# Patient Record
Sex: Female | Born: 1980
Health system: Southern US, Community
[De-identification: ages and names within clinical notes are randomized; demographics above are authoritative.]

## PROBLEM LIST (undated history)

## (undated) ENCOUNTER — Inpatient Hospital Stay (HOSPITAL_COMMUNITY): Payer: Self-pay

## (undated) DIAGNOSIS — M549 Dorsalgia, unspecified: Secondary | ICD-10-CM

## (undated) DIAGNOSIS — O24419 Gestational diabetes mellitus in pregnancy, unspecified control: Secondary | ICD-10-CM

## (undated) DIAGNOSIS — N200 Calculus of kidney: Secondary | ICD-10-CM

## (undated) DIAGNOSIS — R51 Headache: Secondary | ICD-10-CM

## (undated) DIAGNOSIS — N390 Urinary tract infection, site not specified: Secondary | ICD-10-CM

## (undated) DIAGNOSIS — E119 Type 2 diabetes mellitus without complications: Secondary | ICD-10-CM

## (undated) DIAGNOSIS — G43909 Migraine, unspecified, not intractable, without status migrainosus: Secondary | ICD-10-CM

## (undated) DIAGNOSIS — F419 Anxiety disorder, unspecified: Secondary | ICD-10-CM

## (undated) HISTORY — PX: CHOLECYSTECTOMY: SHX55

## (undated) HISTORY — PX: DILATION AND CURETTAGE OF UTERUS: SHX78

## (undated) HISTORY — PX: WISDOM TOOTH EXTRACTION: SHX21

## (undated) HISTORY — PX: TONSILLECTOMY: SUR1361

---

## 2013-05-14 ENCOUNTER — Encounter (HOSPITAL_COMMUNITY): Payer: Self-pay | Admitting: *Deleted

## 2013-05-14 ENCOUNTER — Inpatient Hospital Stay (HOSPITAL_COMMUNITY)
Admission: AD | Admit: 2013-05-14 | Discharge: 2013-05-14 | Disposition: A | Discharge status: Home / Self Care | Payer: Medicaid Other | Source: Ambulatory Visit | Attending: Obstetrics & Gynecology | Admitting: Obstetrics & Gynecology

## 2013-05-14 ENCOUNTER — Inpatient Hospital Stay (HOSPITAL_COMMUNITY): Payer: Medicaid Other

## 2013-05-14 DIAGNOSIS — R109 Unspecified abdominal pain: Secondary | ICD-10-CM | POA: Insufficient documentation

## 2013-05-14 DIAGNOSIS — N390 Urinary tract infection, site not specified: Secondary | ICD-10-CM | POA: Insufficient documentation

## 2013-05-14 DIAGNOSIS — O26899 Other specified pregnancy related conditions, unspecified trimester: Secondary | ICD-10-CM

## 2013-05-14 DIAGNOSIS — A5901 Trichomonal vulvovaginitis: Secondary | ICD-10-CM | POA: Insufficient documentation

## 2013-05-14 DIAGNOSIS — O2390 Unspecified genitourinary tract infection in pregnancy, unspecified trimester: Secondary | ICD-10-CM | POA: Insufficient documentation

## 2013-05-14 DIAGNOSIS — A599 Trichomoniasis, unspecified: Secondary | ICD-10-CM

## 2013-05-14 DIAGNOSIS — O209 Hemorrhage in early pregnancy, unspecified: Secondary | ICD-10-CM

## 2013-05-14 DIAGNOSIS — O98819 Other maternal infectious and parasitic diseases complicating pregnancy, unspecified trimester: Secondary | ICD-10-CM | POA: Insufficient documentation

## 2013-05-14 LAB — URINALYSIS, ROUTINE W REFLEX MICROSCOPIC
Bilirubin Urine: NEGATIVE
Ketones, ur: NEGATIVE mg/dL
Nitrite: NEGATIVE
Protein, ur: NEGATIVE mg/dL
Urobilinogen, UA: 0.2 mg/dL (ref 0.0–1.0)
pH: 6.5 (ref 5.0–8.0)

## 2013-05-14 LAB — WET PREP, GENITAL
Clue Cells Wet Prep HPF POC: NONE SEEN
Yeast Wet Prep HPF POC: NONE SEEN

## 2013-05-14 LAB — URINE MICROSCOPIC-ADD ON

## 2013-05-14 LAB — ABO/RH: ABO/RH(D): A POS

## 2013-05-14 LAB — CBC
HCT: 40.9 % (ref 36.0–46.0)
Hemoglobin: 14 g/dL (ref 12.0–15.0)
MCH: 28.5 pg (ref 26.0–34.0)
MCHC: 34.2 g/dL (ref 30.0–36.0)
MCV: 83.1 fL (ref 78.0–100.0)
Platelets: 301 10*3/uL (ref 150–400)
RBC: 4.92 MIL/uL (ref 3.87–5.11)
RDW: 12.3 % (ref 11.5–15.5)
WBC: 9.7 10*3/uL (ref 4.0–10.5)

## 2013-05-14 LAB — HCG, QUANTITATIVE, PREGNANCY: hCG, Beta Chain, Quant, S: 26562 m[IU]/mL — ABNORMAL HIGH (ref <5)

## 2013-05-14 MED ORDER — NITROFURANTOIN MONOHYD MACRO 100 MG PO CAPS: 100.0000 mg | ORAL_CAPSULE | Freq: Two times a day (BID) | ORAL | Status: DC

## 2013-05-14 MED ORDER — METRONIDAZOLE 500 MG PO TABS: 500.0000 mg | ORAL_TABLET | Freq: Once | ORAL | Status: DC

## 2013-05-14 NOTE — MAU Note (Signed)
32 yo, G2P0 at [redacted]w[redacted]d, presents to MAU with c/o vaginal bleeding noted after wiping last night. Reports lower abdominal cramping noted this morning. Last intercourse 5 days ago.

## 2013-05-14 NOTE — Discharge Instructions (Signed)
Abdominal Pain During Pregnancy  Abdominal discomfort is common in pregnancy. Most of the time, it does not cause harm. There are many causes of abdominal pain. Some causes are more serious than others. Some of the causes of abdominal pain in pregnancy are easily diagnosed. Occasionally, the diagnosis takes time to understand. Other times, the cause is not determined. Abdominal pain can be a sign that something is very wrong with the pregnancy, or the pain may have nothing to do with the pregnancy at all. For this reason, always tell your caregiver if you have any abdominal discomfort.  CAUSES  Common and harmless causes of abdominal pain include:   Constipation.   Excess gas and bloating.   Round ligament pain. This is pain that is felt in the folds of the groin.   The position the baby or placenta is in.   Baby kicks.   Braxton-Hicks contractions. These are mild contractions that do not cause cervical dilation.  Serious causes of abdominal pain include:   Ectopic pregnancy. This happens when a fertilized egg implants outside of the uterus.   Miscarriage.   Preterm labor. This is when labor starts at less than 37 weeks of pregnancy.   Placental abruption. This is when the placenta partially or completely separates from the uterus.   Preeclampsia. This is often associated with high blood pressure and has been referred to as "toxemia in pregnancy."   Uterine or amniotic fluid infections.  Causes unrelated to pregnancy include:   Urinary tract infection.   Gallbladder stones or inflammation.   Hepatitis or other liver illness.   Intestinal problems, stomach flu, food poisoning, or ulcer.   Appendicitis.   Kidney (renal) stones.   Kidney infection (pylonephritis).  HOME CARE INSTRUCTIONS   For mild pain:   Do not have sexual intercourse or put anything in your vagina until your symptoms go away completely.   Get plenty of rest until your pain improves. If your pain does not improve in 1 hour, call  your caregiver.   Drink clear fluids if you feel nauseous. Avoid solid food as long as you are uncomfortable or nauseous.   Only take medicine as directed by your caregiver.   Keep all follow-up appointments with your caregiver.  SEEK IMMEDIATE MEDICAL CARE IF:   You are bleeding, leaking fluid, or passing tissue from the vagina.   You have increasing pain or cramping.   You have persistent vomiting.   You have painful or bloody urination.   You have a fever.   You notice a decrease in your baby's movements.   You have extreme weakness or feel faint.   You have shortness of breath, with or without abdominal pain.   You develop a severe headache with abdominal pain.   You have abnormal vaginal discharge with abdominal pain.   You have persistent diarrhea.   You have abdominal pain that continues even after rest, or gets worse.  MAKE SURE YOU:    Understand these instructions.   Will watch your condition.   Will get help right away if you are not doing well or get worse.  Document Released: 08/04/2005 Document Revised: 10/27/2011 Document Reviewed: 02/28/2011  Exit[employer] Patient Information 2014 [employer].

## 2013-05-14 NOTE — MAU Note (Signed)
Pt presents with complaints of abdominal pain and a small amount of vaginal bleeding since last night.

## 2013-05-14 NOTE — MAU Provider Note (Signed)
CSN: [id number]     Arrival date & time 05/14/13  1514 History   None    Chief Complaint  Patient presents with  . Abdominal Pain  . Vaginal Bleeding   (Consider location/radiation/quality/duration/timing/severity/associated sxs/prior Treatment) HPI Dawn Logan is a 32 y.o. G2P0010 at [redacted]w[redacted]d. LMP 8/15, nl. Pt started having low abd pressure off/onsince last night. She has had pink spotting with wiping since last evening also. No change in discharge,odor or itching. She has frequency, no urgency or burning. She feels nauseated, no vomiting, constipation or diarrhea. She has applied for Medicaid   History reviewed. No pertinent past medical history. Past Surgical History  Procedure Laterality Date  . Cholecystectomy    . Dilation and curettage of uterus     History reviewed. No pertinent family history. History  Substance Use Topics  . Smoking status: Former Smoker    Types: Cigarettes  . Smokeless tobacco: Never Used  . Alcohol Use: No   OB History   Grav Para Term Preterm Abortions TAB SAB Ect Mult Living   2 0 0 0 1 0 1 0 0 0      Review of Systems  Constitutional: Negative for fever and chills.  Gastrointestinal: Positive for nausea. Negative for vomiting, diarrhea and constipation.  Genitourinary: Positive for frequency, vaginal bleeding and pelvic pain. Negative for dysuria, urgency and vaginal discharge.    Allergies  Review of patient's allergies indicates no known allergies.  Home Medications  No current outpatient prescriptions on file. BP 119/65  Pulse 85  Temp(Src) 97.8 F (36.6 C)  Resp 16  Ht 5\' 4"  (1.626 m)  Wt 148 lb (67.132 kg)  BMI 25.39 kg/m2  LMP 04/01/2013 Physical Exam  Constitutional: She is oriented to person, place, and time. She appears well-developed and well-nourished.  Abdominal: Soft. There is no tenderness.  Genitourinary:  Pelvic exam: Ext gen- nl anatomy, skin intact Vagina- rose colored thick discharge Cx- closed Uterus-  enlarged 6-8 wk size, non tender Adn- non-tender, no masses palp  Musculoskeletal: Normal range of motion.  Neurological: She is alert and oriented to person, place, and time.  Skin: Skin is warm and dry.  Psychiatric: She has a normal mood and affect. Her behavior is normal.    ED Course  Procedures (including critical care time) Labs Review Labs Reviewed  URINALYSIS, ROUTINE W REFLEX MICROSCOPIC - Abnormal; Notable for the following:    Hgb urine dipstick TRACE (*)    Leukocytes, UA TRACE (*)    All other components within normal limits  URINE MICROSCOPIC-ADD ON - Abnormal; Notable for the following:    Squamous Epithelial / LPF FEW (*)    Bacteria, UA FEW (*)    All other components within normal limits  POCT PREGNANCY, URINE - Abnormal; Notable for the following:    Preg Test, Ur POSITIVE (*)    All other components within normal limits  URINE CULTURE  WET PREP, GENITAL  GC/CHLAMYDIA PROBE AMP  CBC  HCG, QUANTITATIVE, PREGNANCY  ABO/RH   Imaging Review No results found. Results for orders placed during the hospital encounter of 05/14/13 (from the past 24 hour(s))  URINALYSIS, ROUTINE W REFLEX MICROSCOPIC     Status: Abnormal   Collection Time    05/14/13  3:20 PM      Result Value Range   Color, Urine YELLOW  YELLOW   APPearance CLEAR  CLEAR   Specific Gravity, Urine 1.015  1.005 - 1.030   pH 6.5  5.0 - 8.0  Glucose, UA NEGATIVE  NEGATIVE mg/dL   Hgb urine dipstick TRACE (*) NEGATIVE   Bilirubin Urine NEGATIVE  NEGATIVE   Ketones, ur NEGATIVE  NEGATIVE mg/dL   Protein, ur NEGATIVE  NEGATIVE mg/dL   Urobilinogen, UA 0.2  0.0 - 1.0 mg/dL   Nitrite NEGATIVE  NEGATIVE   Leukocytes, UA TRACE (*) NEGATIVE  URINE MICROSCOPIC-ADD ON     Status: Abnormal   Collection Time    05/14/13  3:20 PM      Result Value Range   Squamous Epithelial / LPF FEW (*) RARE   WBC, UA 3-6  <3 WBC/hpf   RBC / HPF 0-2  <3 RBC/hpf   Bacteria, UA FEW (*) RARE  POCT PREGNANCY, URINE      Status: Abnormal   Collection Time    05/14/13  3:29 PM      Result Value Range   Preg Test, Ur POSITIVE (*) NEGATIVE  WET PREP, GENITAL     Status: Abnormal   Collection Time    05/14/13  4:15 PM      Result Value Range   Yeast Wet Prep HPF POC NONE SEEN  NONE SEEN   Trich, Wet Prep MODERATE (*) NONE SEEN   Clue Cells Wet Prep HPF POC NONE SEEN  NONE SEEN   WBC, Wet Prep HPF POC MANY (*) NONE SEEN  CBC     Status: None   Collection Time    05/14/13  4:25 PM      Result Value Range   WBC 9.7  4.0 - 10.5 K/uL   RBC 4.92  3.87 - 5.11 MIL/uL   Hemoglobin 14.0  12.0 - 15.0 g/dL   HCT 47.8  29.5 - 62.1 %   MCV 83.1  78.0 - 100.0 fL   MCH 28.5  26.0 - 34.0 pg   MCHC 34.2  30.0 - 36.0 g/dL   RDW 30.8  65.7 - 84.6 %   Platelets 301  150 - 400 K/uL  ABO/RH     Status: None   Collection Time    05/14/13  4:25 PM      Result Value Range   ABO/RH(D) A POS    HCG, QUANTITATIVE, PREGNANCY     Status: Abnormal   Collection Time    05/14/13  4:25 PM      Result Value Range   hCG, Beta Chain, Quant, [place] [address] (*) <5 mIU/mL   US Ob Comp Less 14 Wks  05/14/2013   *RADIOLOGY REPORT*  Clinical Data: 32 year old pregnant female with pelvic pain, cramping and bleeding.  Estimated gestational age of [redacted] weeks 1 day by LMP.  OBSTETRIC <14 WK [place] AND TRANSVAGINAL OB US  Technique:  Both transabdominal and transvaginal ultrasound examinations were performed for complete evaluation of the gestation as well as the maternal uterus, adnexal regions, and pelvic cul-de-sac.  Transvaginal technique was performed to assess early pregnancy.  Comparison:  None  Intrauterine gestational sac:  Visualized/normal in shape. Yolk sac: Visualized Embryo: Visualized Cardiac Activity: Visualized Heart Rate: 126 bpm  CRL: 6.9  mm  6 w  4 d        [place] EDC: 01/03/2014  Maternal uterus/adnexae: A small subchorionic hemorrhage is noted. The ovaries bilaterally are within normal limits. There is no evidence of free fluid or adnexal  mass.  IMPRESSION: Single living intrauterine gestation with estimated gestational age of [redacted] weeks 4 days by this ultrasound.  Small subchorionic hemorrhage.   Original Report Authenticated By: Tinnie Gens  Si Gaul, M.D.   US Ob Transvaginal  05/14/2013   *RADIOLOGY REPORT*  Clinical Data: 31 year old pregnant female with pelvic pain, cramping and bleeding.  Estimated gestational age of [redacted] weeks 1 day by LMP.  OBSTETRIC <14 WK [place] AND TRANSVAGINAL OB US  Technique:  Both transabdominal and transvaginal ultrasound examinations were performed for complete evaluation of the gestation as well as the maternal uterus, adnexal regions, and pelvic cul-de-sac.  Transvaginal technique was performed to assess early pregnancy.  Comparison:  None  Intrauterine gestational sac:  Visualized/normal in shape. Yolk sac: Visualized Embryo: Visualized Cardiac Activity: Visualized Heart Rate: 126 bpm  CRL: 6.9  mm  6 w  4 d        [place] EDC: 01/03/2014  Maternal uterus/adnexae: A small subchorionic hemorrhage is noted. The ovaries bilaterally are within normal limits. There is no evidence of free fluid or adnexal mass.  IMPRESSION: Single living intrauterine gestation with estimated gestational age of [redacted] weeks 4 days by this ultrasound.  Small subchorionic hemorrhage.   Original Report Authenticated By: Harmon Pier, M.D.    MDM  No diagnosis found. ASSESSMENT:  6 4/7 wks IUP with sm SCH UTI Trichomonas  PLAN:  Macrobid bid x 7 Metronidazole 2 gm po x 1, partner needs tx Cultures pending List of providers for [facility]

## 2013-05-15 ENCOUNTER — Other Ambulatory Visit: Payer: Self-pay | Admitting: Advanced Practice Midwife

## 2013-05-15 DIAGNOSIS — N390 Urinary tract infection, site not specified: Secondary | ICD-10-CM | POA: Insufficient documentation

## 2013-05-15 DIAGNOSIS — A599 Trichomoniasis, unspecified: Secondary | ICD-10-CM | POA: Insufficient documentation

## 2013-05-15 LAB — GC/CHLAMYDIA PROBE AMP
CT Probe RNA: NEGATIVE
GC Probe RNA: NEGATIVE

## 2013-05-15 MED ORDER — CEPHALEXIN 500 MG PO CAPS: 500.0000 mg | ORAL_CAPSULE | Freq: Four times a day (QID) | ORAL | Status: DC

## 2013-05-15 MED ORDER — SULFAMETHOXAZOLE-TMP DS 800-160 MG PO TABS: 1.0000 | ORAL_TABLET | Freq: Two times a day (BID) | ORAL | Status: DC

## 2013-05-15 NOTE — Progress Notes (Signed)
Patient called. Cannot afford Macrobid. New Rx called to [facility] for Keflex x 7d

## 2013-05-16 ENCOUNTER — Encounter (HOSPITAL_COMMUNITY): Payer: Self-pay

## 2013-05-16 ENCOUNTER — Inpatient Hospital Stay (HOSPITAL_COMMUNITY)
Admission: AD | Admit: 2013-05-16 | Discharge: 2013-05-16 | Disposition: A | Discharge status: Home / Self Care | Payer: Medicaid Other | Source: Ambulatory Visit | Attending: Obstetrics & Gynecology | Admitting: Obstetrics & Gynecology

## 2013-05-16 DIAGNOSIS — O98819 Other maternal infectious and parasitic diseases complicating pregnancy, unspecified trimester: Secondary | ICD-10-CM | POA: Insufficient documentation

## 2013-05-16 DIAGNOSIS — O26899 Other specified pregnancy related conditions, unspecified trimester: Secondary | ICD-10-CM

## 2013-05-16 DIAGNOSIS — A5901 Trichomonal vulvovaginitis: Secondary | ICD-10-CM

## 2013-05-16 DIAGNOSIS — N949 Unspecified condition associated with female genital organs and menstrual cycle: Secondary | ICD-10-CM | POA: Insufficient documentation

## 2013-05-16 DIAGNOSIS — R110 Nausea: Secondary | ICD-10-CM | POA: Insufficient documentation

## 2013-05-16 DIAGNOSIS — R109 Unspecified abdominal pain: Secondary | ICD-10-CM | POA: Insufficient documentation

## 2013-05-16 HISTORY — DX: Urinary tract infection, site not specified: N39.0

## 2013-05-16 LAB — URINE CULTURE
Colony Count: NO GROWTH
Culture: NO GROWTH

## 2013-05-16 LAB — URINALYSIS, ROUTINE W REFLEX MICROSCOPIC
Ketones, ur: NEGATIVE mg/dL
Nitrite: NEGATIVE
Protein, ur: NEGATIVE mg/dL
Specific Gravity, Urine: 1.02 (ref 1.005–1.030)
pH: 6 (ref 5.0–8.0)

## 2013-05-16 LAB — HCG, QUANTITATIVE, PREGNANCY: hCG, Beta Chain, Quant, S: 35569 m[IU]/mL — ABNORMAL HIGH (ref <5)

## 2013-05-16 LAB — URINE MICROSCOPIC-ADD ON

## 2013-05-16 MED ORDER — OXYCODONE-ACETAMINOPHEN 5-325 MG PO TABS
1.0000 | ORAL_TABLET | Freq: Once | ORAL | Status: AC
  Administered 2013-05-16: 1 via ORAL
  Filled 2013-05-16: qty 1

## 2013-05-16 MED ORDER — OXYCODONE-ACETAMINOPHEN 5-325 MG PO TABS: 2.0000 | ORAL_TABLET | ORAL | Status: DC | PRN

## 2013-05-16 MED ORDER — PROMETHAZINE HCL 25 MG PO TABS
25.0000 mg | ORAL_TABLET | Freq: Once | ORAL | Status: AC
  Administered 2013-05-16: 25 mg via ORAL
  Filled 2013-05-16: qty 1

## 2013-05-16 MED ORDER — PROMETHAZINE HCL 25 MG PO TABS: 25.0000 mg | ORAL_TABLET | Freq: Four times a day (QID) | ORAL | Status: DC | PRN

## 2013-05-16 MED ORDER — METRONIDAZOLE 500 MG PO TABS
2000.0000 mg | ORAL_TABLET | Freq: Once | ORAL | Status: AC
  Administered 2013-05-16: 2000 mg via ORAL
  Filled 2013-05-16: qty 4

## 2013-05-16 NOTE — MAU Note (Signed)
Patient states she was seen on 9-27 and is currently taking medication for a UTI. Patient states she woke up this am with severe lower abdominal pain, nausea, no bleeding.

## 2013-05-16 NOTE — MAU Provider Note (Signed)
A viable IUP was noted 05/14/2013 Attestation of Attending Supervision of Advanced Practitioner (CNM/NP): Evaluation and management procedures were performed by the Advanced Practitioner under my supervision and collaboration.  I have reviewed the Advanced Practitioner's note and chart, and I agree with the management and plan.  HARRAWAY-SMITH, Cherrelle Plante 12:12 PM

## 2013-05-16 NOTE — MAU Provider Note (Signed)
History     CSN: [id number]  Arrival date and time: 05/16/13 4696   First Provider Initiated Contact with Patient 05/16/13 [phone]      Chief Complaint  Patient presents with  . Abdominal Pain  . Nausea   HPI Comments: Dawn Logan 32 y.o. G2P0010 who woke up this morning with abdominal cramping and nausea. She was seen 05/14/13 for same and diagnosed with UTI, Trich and U/S showed Small [facility]. She was given Flagyl and Keflex. She has not gotten Flagyl prescription filled.  Urine culture was negative. She still has pink discharge. Her partner has not been treated and they have not resumed sexual intercourse.  BHCG was 26,562.     Abdominal Pain  Patient is a 32 y.o. female presenting with abdominal pain.  Abdominal Pain The primary symptoms of the illness include abdominal pain.      Past Medical History  Diagnosis Date  . UTI (lower urinary tract infection)     Past Surgical History  Procedure Laterality Date  . Cholecystectomy    . Dilation and curettage of uterus      Family History  Problem Relation Age of Onset  . Adopted: Yes    History  Substance Use Topics  . Smoking status: Former Smoker    Types: Cigarettes  . Smokeless tobacco: Never Used  . Alcohol Use: No    Allergies: No Known Allergies  Prescriptions prior to admission  Medication Sig Dispense Refill  . cephALEXin (KEFLEX) 500 MG capsule Take 1 capsule (500 mg total) by mouth 4 (four) times daily.  28 capsule  0  . Prenatal Vit-Fe Fumarate-FA (PRENATAL MULTIVITAMIN) TABS tablet Take 1 tablet by mouth daily at 12 noon.      . metroNIDAZOLE (FLAGYL) 500 MG tablet Take 1 tablet (500 mg total) by mouth once. Take four 500 mg tablets together x1  4 tablet  0  . nitrofurantoin, macrocrystal-monohydrate, (MACROBID) 100 MG capsule Take 1 capsule (100 mg total) by mouth 2 (two) times daily.  14 capsule  0    Review of Systems  Constitutional: Negative.   HENT: Negative.   Eyes: Negative.    Respiratory: Negative.   Cardiovascular: Negative.   Gastrointestinal: Positive for abdominal pain.  Genitourinary: Negative.   Musculoskeletal: Negative.   Skin: Negative.   Neurological: Negative.   Psychiatric/Behavioral: Negative.    Physical Exam   Blood pressure 100/49, pulse 69, temperature 97.7 F (36.5 C), temperature source Oral, resp. rate 16, last menstrual period 04/01/2013, SpO2 100.00%.  Physical Exam  Constitutional: She is oriented to person, place, and time. She appears well-developed and well-nourished. No distress.  HENT:  Head: Normocephalic and atraumatic.  Eyes: Pupils are equal, round, and reactive to light.  Neck: Normal range of motion.  Cardiovascular: Normal rate, regular rhythm and normal heart sounds.   Respiratory: Effort normal and breath sounds normal.  GI: Soft. Bowel sounds are normal. She exhibits no distension and no mass. There is no tenderness. There is no rebound and no guarding.  Genitourinary:  External: Negative Vagina: Pink creamy discharge Cervix: Closed Biman: Tenderness in uterus  Musculoskeletal: Normal range of motion.  Neurological: She is alert and oriented to person, place, and time.  Skin: Skin is warm and dry.  Psychiatric: She has a normal mood and affect. Her behavior is normal. Judgment and thought content normal.   Results for orders placed during the hospital encounter of 05/16/13 (from the past 24 hour(s))  URINALYSIS, ROUTINE W REFLEX MICROSCOPIC  Status: Abnormal   Collection Time    05/16/13  7:36 AM      Result Value Range   Color, Urine YELLOW  YELLOW   APPearance CLEAR  CLEAR   Specific Gravity, Urine 1.020  1.005 - 1.030   pH 6.0  5.0 - 8.0   Glucose, UA NEGATIVE  NEGATIVE mg/dL   Hgb urine dipstick SMALL (*) NEGATIVE   Bilirubin Urine NEGATIVE  NEGATIVE   Ketones, ur NEGATIVE  NEGATIVE mg/dL   Protein, ur NEGATIVE  NEGATIVE mg/dL   Urobilinogen, UA 0.2  0.0 - 1.0 mg/dL   Nitrite NEGATIVE  NEGATIVE    Leukocytes, UA SMALL (*) NEGATIVE  URINE MICROSCOPIC-ADD ON     Status: Abnormal   Collection Time    05/16/13  7:36 AM      Result Value Range   Squamous Epithelial / LPF RARE  RARE   WBC, UA 7-10  <3 WBC/hpf   Bacteria, UA FEW (*) RARE   Urine-Other TRICHOMONAS PRESENT    HCG, QUANTITATIVE, PREGNANCY     Status: Abnormal   Collection Time    05/16/13  8:45 AM      Result Value Range   hCG, Beta Chain, Quant, S 355[address] (*) <5 mIU/mL      MAU Course  Procedures  MDM Percocet/phenergan in MAU Repeat BHCG Flagyl 2 Grams in MAU Urine culture negative from 9/27  Assessment and Plan   A: Pelvic pain in pregnancy Trich untreated  P: Phenergan, Percocet / pain improved/ give rx for home Treat Flagyl 2 Grams now Stop Keflex    Carolynn Serve 05/16/2013, 8:08 AM

## 2013-05-17 LAB — URINE CULTURE: Culture: NO GROWTH

## 2013-05-17 NOTE — MAU Provider Note (Signed)
Attestation of Attending Supervision of Advanced Practitioner (CNM/NP): Evaluation and management procedures were performed by the Advanced Practitioner under my supervision and collaboration. I have reviewed the Advanced Practitioner's note and chart, and I agree with the management and plan.  Orby Tangen H. 10:09 AM   

## 2013-06-08 ENCOUNTER — Inpatient Hospital Stay (HOSPITAL_COMMUNITY)
Admission: AD | Admit: 2013-06-08 | Discharge: 2013-06-08 | Disposition: A | Discharge status: Home / Self Care | Payer: Medicaid Other | Source: Ambulatory Visit | Attending: Obstetrics and Gynecology | Admitting: Obstetrics and Gynecology

## 2013-06-08 ENCOUNTER — Encounter (HOSPITAL_COMMUNITY): Payer: Self-pay | Admitting: *Deleted

## 2013-06-08 ENCOUNTER — Inpatient Hospital Stay (HOSPITAL_COMMUNITY): Payer: Medicaid Other

## 2013-06-08 DIAGNOSIS — R109 Unspecified abdominal pain: Secondary | ICD-10-CM | POA: Insufficient documentation

## 2013-06-08 DIAGNOSIS — O4690 Antepartum hemorrhage, unspecified, unspecified trimester: Secondary | ICD-10-CM

## 2013-06-08 DIAGNOSIS — O209 Hemorrhage in early pregnancy, unspecified: Secondary | ICD-10-CM

## 2013-06-08 HISTORY — DX: Anxiety disorder, unspecified: F41.9

## 2013-06-08 HISTORY — DX: Calculus of kidney: N20.0

## 2013-06-08 HISTORY — DX: Headache: R51

## 2013-06-08 LAB — URINALYSIS, ROUTINE W REFLEX MICROSCOPIC
Bilirubin Urine: NEGATIVE
Ketones, ur: NEGATIVE mg/dL
Leukocytes, UA: NEGATIVE
Nitrite: NEGATIVE
Specific Gravity, Urine: 1.01 (ref 1.005–1.030)
Urobilinogen, UA: 0.2 mg/dL (ref 0.0–1.0)
pH: 7.5 (ref 5.0–8.0)

## 2013-06-08 LAB — URINE MICROSCOPIC-ADD ON

## 2013-06-08 MED ORDER — CONCEPT DHA 53.5-38-1 MG PO CAPS: 1.0000 | ORAL_CAPSULE | Freq: Every day | ORAL | Status: DC

## 2013-06-08 NOTE — MAU Note (Signed)
Noted bright red blood when went to bathroom. abd cramping started at same time.

## 2013-06-08 NOTE — MAU Provider Note (Signed)
History     CSN: [id number]  Arrival date and time: 06/08/13 1334   First Provider Initiated Contact with Patient 06/08/13 1453      Chief Complaint  Patient presents with  . Vaginal Bleeding  . Abdominal Cramping   HPI  Pt is a G2P0010 at [redacted]w[redacted]d wks IUP here with report of vaginal bleeding that started at 12:30 today.  No report of recent intercourse.  Seen in MAU on 05/16/13 and diagnosed with 6 wk IUP with small subchorionic hemorrhage and trichomoniasis.  Pt reports both she and her partner were treated for trichomoniasis.  Here just to make sure everything is going well.     Past Medical History  Diagnosis Date  . UTI (lower urinary tract infection)   . Headache(784.0)     migraines since age 54  . Kidney stones   . Anxiety     not currently on meds    Past Surgical History  Procedure Laterality Date  . Cholecystectomy    . Dilation and curettage of uterus      Family History  Problem Relation Age of Onset  . Adopted: Yes    History  Substance Use Topics  . Smoking status: Current Every Day Smoker -- 0.25 packs/day    Types: Cigarettes  . Smokeless tobacco: Former Neurosurgeon     Comment: cutting down  . Alcohol Use: No    Allergies: No Known Allergies  Prescriptions prior to admission  Medication Sig Dispense Refill  . Prenatal Vit-Fe Fumarate-FA (PRENATAL MULTIVITAMIN) TABS tablet Take 1 tablet by mouth daily at 12 noon.      . promethazine (PHENERGAN) 25 MG tablet Take 1 tablet (25 mg total) by mouth every 6 (six) hours as needed for nausea.  30 tablet  0    Review of Systems  Gastrointestinal: Negative for abdominal pain.  Genitourinary:       Light vaginal bleeding  All other systems reviewed and are negative.   Physical Exam   Blood pressure 114/68, pulse 96, temperature 98.5 F (36.9 C), temperature source Oral, resp. rate 18, height 5' 3.5" (1.613 m), weight 69.4 kg (153 lb), last menstrual period 04/01/2013.  Physical Exam  Constitutional: She  is oriented to person, place, and time. She appears well-developed and well-nourished. No distress.  HENT:  Head: Normocephalic.  Neck: Normal range of motion. Neck supple.  Cardiovascular: Normal rate, regular rhythm and normal heart sounds.   Respiratory: Effort normal and breath sounds normal. No respiratory distress.  GI: Soft. There is no tenderness.  Genitourinary: There is bleeding (scant) around the vagina.  Neurological: She is alert and oriented to person, place, and time.  Skin: Skin is warm and dry.    MAU Course  Procedures Results for orders placed during the hospital encounter of 06/08/13 (from the past 24 hour(s))  URINALYSIS, ROUTINE W REFLEX MICROSCOPIC     Status: Abnormal   Collection Time    06/08/13  1:45 PM      Result Value Range   Color, Urine YELLOW  YELLOW   APPearance CLEAR  CLEAR   Specific Gravity, Urine 1.010  1.005 - 1.030   pH 7.5  5.0 - 8.0   Glucose, UA NEGATIVE  NEGATIVE mg/dL   Hgb urine dipstick LARGE (*) NEGATIVE   Bilirubin Urine NEGATIVE  NEGATIVE   Ketones, ur NEGATIVE  NEGATIVE mg/dL   Protein, ur NEGATIVE  NEGATIVE mg/dL   Urobilinogen, UA 0.2  0.0 - 1.0 mg/dL   Nitrite NEGATIVE  NEGATIVE   Leukocytes, UA NEGATIVE  NEGATIVE  URINE MICROSCOPIC-ADD ON     Status: Abnormal   Collection Time    06/08/13  1:45 PM      Result Value Range   Squamous Epithelial / LPF FEW (*) RARE   WBC, UA 0-2  <3 WBC/hpf   RBC / HPF 0-2  <3 RBC/hpf   Bacteria, UA FEW (*) RARE   Ultrasound: FINDINGS:  Intrauterine gestational sac: Visualized/normal in shape.  Yolk sac: Visualized  Embryo: Visualized  Cardiac Activity: Visualized  Heart Rate: 172 bpm  MSD: mm w d  CRL: 37 mm 10 w 4 d [place] EDC: 12/31/2013  Maternal uterus/adnexae: No subchorionic hemorrhage. Ovaries are  symmetric in size and echotexture. No adnexal mass. No free fluid.  IMPRESSION:  Ten week 4 day intrauterine pregnancy. Fetal heart rate 172 beats  per min. No acute maternal  findings.  Assessment and Plan  Vaginal Bleeding in Early Pregnancy  Plan: Discharge to home Given bleeding precautions New OB appt on 06/28/13 at 08:00 am at [facility]  [facility] 06/08/2013, 4:08 PM

## 2013-06-10 NOTE — MAU Provider Note (Signed)
Attestation of Attending Supervision of Advanced Practitioner (CNM/NP): Evaluation and management procedures were performed by the Advanced Practitioner under my supervision and collaboration.  I have reviewed the Advanced Practitioner's note and chart, and I agree with the management and plan.  Jhoselin Crume 06/10/2013 12:10 PM

## 2013-06-26 ENCOUNTER — Inpatient Hospital Stay (HOSPITAL_COMMUNITY)
Admission: AD | Admit: 2013-06-26 | Discharge: 2013-06-26 | Disposition: A | Discharge status: Home / Self Care | Payer: Medicaid Other | Source: Ambulatory Visit | Attending: Obstetrics & Gynecology | Admitting: Obstetrics & Gynecology

## 2013-06-26 ENCOUNTER — Encounter (HOSPITAL_COMMUNITY): Payer: Self-pay | Admitting: *Deleted

## 2013-06-26 DIAGNOSIS — J3489 Other specified disorders of nose and nasal sinuses: Secondary | ICD-10-CM | POA: Insufficient documentation

## 2013-06-26 DIAGNOSIS — J029 Acute pharyngitis, unspecified: Secondary | ICD-10-CM | POA: Insufficient documentation

## 2013-06-26 DIAGNOSIS — R059 Cough, unspecified: Secondary | ICD-10-CM | POA: Insufficient documentation

## 2013-06-26 DIAGNOSIS — O99891 Other specified diseases and conditions complicating pregnancy: Secondary | ICD-10-CM | POA: Insufficient documentation

## 2013-06-26 DIAGNOSIS — J069 Acute upper respiratory infection, unspecified: Secondary | ICD-10-CM

## 2013-06-26 DIAGNOSIS — R05 Cough: Secondary | ICD-10-CM | POA: Insufficient documentation

## 2013-06-26 MED ORDER — DEXTROMETHORPHAN-GUAIFENESIN 10-100 MG/5ML PO LIQD: 5.0000 mL | ORAL | Status: DC | PRN

## 2013-06-26 NOTE — MAU Provider Note (Signed)
Attestation of Attending Supervision of Advanced Practitioner (CNM/NP): Evaluation and management procedures were performed by the Advanced Practitioner under my supervision and collaboration. I have reviewed the Advanced Practitioner's note and chart, and I agree with the management and plan.  Jeannett Dekoning H. 5:51 PM

## 2013-06-26 NOTE — MAU Provider Note (Signed)
Chief Complaint: Sore Throat, Cough and Nasal Congestion   First Provider Initiated Contact with Patient 06/26/13 1547     SUBJECTIVE HPI: Dawn Logan is a 32 y.o. G2P0010 at [redacted]w[redacted]d by LMP with this morning with nasal stuffiness and sore throat. Has had dry cough. Mild nausea which has been related to pregnancy. Denies chest pain, shortness of breath, fevers/chills. Works in [employer] and would like and excuse. No known illness or strep throat contact. Has not tried any medications.  Pregnancy course: New OB visit at [facility] scheduled in 3 days. No known pregnancy problems.  Past Medical History  Diagnosis Date  . UTI (lower urinary tract infection)   . Headache(784.0)     migraines since age 29  . Kidney stones   . Anxiety     not currently on meds   OB History  Gravida Para Term Preterm AB SAB TAB Ectopic Multiple Living  2 0 0 0 1 1 0 0 0 0     # Outcome Date GA Lbr Len/2nd Weight Sex Delivery Anes PTL Lv  2 CUR           1 SAB              Past Surgical History  Procedure Laterality Date  . Cholecystectomy    . Dilation and curettage of uterus     History   Social History  . Marital Status: Single    Spouse Name: N/A    Number of Children: N/A  . Years of Education: N/A   Occupational History  . Not on file.   Social History Main Topics  . Smoking status: Current Every Day Smoker -- 0.25 packs/day    Types: Cigarettes  . Smokeless tobacco: Former Neurosurgeon     Comment: cutting down  . Alcohol Use: No  . Drug Use: No  . Sexual Activity: Yes    Birth Control/ Protection: None   Other Topics Concern  . Not on file   Social History Narrative  . No narrative on file   No current facility-administered medications on file prior to encounter.   Current Outpatient Prescriptions on File Prior to Encounter  Medication Sig Dispense Refill  . Prenat-FeFum-FePo-FA-Omega 3 (CONCEPT DHA) 53.5-38-1 MG CAPS Take 1 tablet by mouth daily.  30 capsule  2  . promethazine  (PHENERGAN) 25 MG tablet Take 1 tablet (25 mg total) by mouth every 6 (six) hours as needed for nausea.  30 tablet  0   Allergies  Allergen Reactions  . Latex Hives    ROS: Pertinent items in HPI   OBJECTIVE Blood pressure 116/55, pulse 98, temperature 98.2 F (36.8 C), temperature source Oral, resp. rate 16, height 5\' 5"  (1.651 m), weight 68.153 kg (150 lb 4 oz), last menstrual period 04/01/2013. GENERAL: Well-developed, well-nourished female in no acute distress. No cough noted HEENT: Nasal mucosa normal; oropharynx slightly red, no edema or exudate.  NECK: no palpable lymphadenopathy though minimal submaxillary tenderness HEART: RRR without murmur RESP: normal effort, CTA bilat ABDOMEN: Soft, non-tender; S=D. DT FHT 150's EXTREMITIES: Nontender, no edema NEURO: Alert and oriented tenderness or masses  LAB RESULTS No results found for this or any previous visit (from the past 24 hour(s)).  IMAGING US Ob Transvaginal  06/08/2013   CLINICAL DATA:  Vaginal bleeding.  EXAM: TRANSVAGINAL OB ULTRASOUND  TECHNIQUE: Transvaginal ultrasound was performed for complete evaluation of the gestation as well as the maternal uterus, adnexal regions, and pelvic cul-de-sac.  COMPARISON:  None.  FINDINGS: Intrauterine gestational sac: Visualized/normal in shape.  Yolk sac:  Visualized  Embryo:  Visualized  Cardiac Activity: Visualized  Heart Rate: 172 bpm  MSD:   mm    w     d  CRL:   37  mm   10 w 4 d                  [place] EDC: 12/31/2013  Maternal uterus/adnexae: No subchorionic hemorrhage. Ovaries are symmetric in size and echotexture. No adnexal mass. No free fluid.  IMPRESSION: Ten week 4 day intrauterine pregnancy. Fetal heart rate 172 beats per min. No acute maternal findings.   Electronically Signed   By: Charlett Nose M.D.   On: 06/08/2013 15:49      ASSESSMENT 1. URI, acute   G2P0010 at [redacted]w[redacted]d  PLAN Discharge home AVS instructions on URI and pharyngitis   Medication List          CONCEPT DHA 53.5-38-1 MG Caps  Take 1 tablet by mouth daily.     dextromethorphan-guaiFENesin 10-100 MG/5ML liquid  Commonly known as:  TUSSIN DM  Take 5 mLs by mouth every 4 (four) hours as needed for cough.     promethazine 25 MG tablet  Commonly known as:  PHENERGAN  Take 1 tablet (25 mg total) by mouth every 6 (six) hours as needed for nausea.       Follow-up Information   Follow up with [name] On 06/28/2013.   Specialty:  Obstetrics and Gynecology   Contact information:   [address] [place] [address] [phone]      Dawn Logan, CNM 06/26/2013  3:54 PM

## 2013-06-26 NOTE — MAU Note (Signed)
Patient presents with complaints of sore throat, cough and stuffy nose.

## 2013-06-28 ENCOUNTER — Other Ambulatory Visit (HOSPITAL_COMMUNITY)
Admission: RE | Admit: 2013-06-28 | Discharge: 2013-06-28 | Disposition: A | Discharge status: Home / Self Care | Payer: Medicaid Other | Source: Ambulatory Visit | Attending: Obstetrics & Gynecology | Admitting: Obstetrics & Gynecology

## 2013-06-28 ENCOUNTER — Encounter: Payer: Self-pay | Admitting: Obstetrics & Gynecology

## 2013-06-28 ENCOUNTER — Ambulatory Visit (INDEPENDENT_AMBULATORY_CARE_PROVIDER_SITE_OTHER): Payer: Medicaid Other | Admitting: Obstetrics & Gynecology

## 2013-06-28 VITALS — BP 112/58 | Temp 97.2°F | Ht 63.0 in | Wt 148.0 lb

## 2013-06-28 DIAGNOSIS — Z3401 Encounter for supervision of normal first pregnancy, first trimester: Secondary | ICD-10-CM

## 2013-06-28 DIAGNOSIS — O210 Mild hyperemesis gravidarum: Secondary | ICD-10-CM

## 2013-06-28 DIAGNOSIS — Z113 Encounter for screening for infections with a predominantly sexual mode of transmission: Secondary | ICD-10-CM | POA: Insufficient documentation

## 2013-06-28 DIAGNOSIS — Z1151 Encounter for screening for human papillomavirus (HPV): Secondary | ICD-10-CM | POA: Insufficient documentation

## 2013-06-28 DIAGNOSIS — Z01419 Encounter for gynecological examination (general) (routine) without abnormal findings: Secondary | ICD-10-CM | POA: Insufficient documentation

## 2013-06-28 DIAGNOSIS — Z23 Encounter for immunization: Secondary | ICD-10-CM

## 2013-06-28 LAB — POCT URINALYSIS DIP (DEVICE)
Glucose, UA: NEGATIVE mg/dL
Hgb urine dipstick: NEGATIVE
Leukocytes, UA: NEGATIVE
Nitrite: NEGATIVE
Protein, ur: NEGATIVE mg/dL
Specific Gravity, Urine: 1.025 (ref 1.005–1.030)
Urobilinogen, UA: 0.2 mg/dL (ref 0.0–1.0)

## 2013-06-28 MED ORDER — PROMETHAZINE HCL 25 MG PO TABS: 25.0000 mg | ORAL_TABLET | Freq: Four times a day (QID) | ORAL | Status: DC | PRN

## 2013-06-28 NOTE — Patient Instructions (Addendum)
Pregnancy - First Trimester During sexual intercourse, millions of sperm go into the vagina. Only 1 sperm will penetrate and fertilize the female egg while it is in the Fallopian tube. One week later, the fertilized egg implants into the wall of the uterus. An embryo begins to develop into a baby. At 6 to 8 weeks, the eyes and face are formed and the heartbeat can be seen on ultrasound. At the end of 12 weeks (first trimester), all the baby's organs are formed. Now that you are pregnant, you will want to do everything you can to have a healthy baby. Two of the most important things are to get good prenatal care and follow your caregiver's instructions. Prenatal care is all the medical care you receive before the baby's birth. It is given to prevent, find, and treat problems during the pregnancy and childbirth. PRENATAL EXAMS  During prenatal visits, your weight, blood pressure, and urine are checked. This is done to make sure you are healthy and progressing normally during the pregnancy.  A pregnant woman should gain 25 to 35 pounds during the pregnancy. However, if you are overweight or underweight, your caregiver will advise you regarding your weight.  Your caregiver will ask and answer questions for you.  Blood work, cervical cultures, other necessary tests, and a Pap test are done during your prenatal exams. These tests are done to check on your health and the probable health of your baby. Tests are strongly recommended and done for HIV with your permission. This is the virus that causes AIDS. These tests are done because medicines can be given to help prevent your baby from being born with this infection should you have been infected without knowing it. Blood work is also used to find out your blood type, previous infections, and follow your blood levels (hemoglobin).  Low hemoglobin (anemia) is common during pregnancy. Iron and vitamins are given to help prevent this. Later in the pregnancy, blood  tests for diabetes will be done along with any other tests if any problems develop.  You may need other tests to make sure you and the baby are doing well. CHANGES DURING THE FIRST TRIMESTER  Your body goes through many changes during pregnancy. They vary from person to person. Talk to your caregiver about changes you notice and are concerned about. Changes can include:  Your menstrual period stops.  The egg and sperm carry the genes that determine what you look like. Genes from you and your partner are forming a baby. The female genes determine whether the baby is a boy or a girl.  Your body increases in girth and you may feel bloated.  Feeling sick to your stomach (nauseous) and throwing up (vomiting). If the vomiting is uncontrollable, call your caregiver.  Your breasts will begin to enlarge and become tender.  Your nipples may stick out more and become darker.  The need to urinate more. Painful urination may mean you have a bladder infection.  Tiring easily.  Loss of appetite.  Cravings for certain kinds of food.  At first, you may gain or lose a couple of pounds.  You may have changes in your emotions from day to day (excited to be pregnant or concerned something may go wrong with the pregnancy and baby).  You may have more vivid and strange dreams. HOME CARE INSTRUCTIONS   It is very important to avoid all smoking, alcohol and non-prescribed drugs during your pregnancy. These affect the formation and growth of the baby.   Avoid chemicals while pregnant to ensure the delivery of a healthy infant.  Start your prenatal visits by the 12th week of pregnancy. They are usually scheduled monthly at first, then more often in the last 2 months before delivery. Keep your caregiver's appointments. Follow your caregiver's instructions regarding medicine use, blood and lab tests, exercise, and diet.  During pregnancy, you are providing food for you and your baby. Eat regular, well-balanced  meals. Choose foods such as meat, fish, milk and other low fat dairy products, vegetables, fruits, and whole-grain breads and cereals. Your caregiver will tell you of the ideal weight gain.  You can help morning sickness by keeping soda crackers at the bedside. Eat a couple before arising in the morning. You may want to use the crackers without salt on them.  Eating 4 to 5 small meals rather than 3 large meals a day also may help the nausea and vomiting.  Drinking liquids between meals instead of during meals also seems to help nausea and vomiting.  A physical sexual relationship may be continued throughout pregnancy if there are no other problems. Problems may be early (premature) leaking of amniotic fluid from the membranes, vaginal bleeding, or belly (abdominal) pain.  Exercise regularly if there are no restrictions. Check with your caregiver or physical therapist if you are unsure of the safety of some of your exercises. Greater weight gain will occur in the last 2 trimesters of pregnancy. Exercising will help:  Control your weight.  Keep you in shape.  Prepare you for labor and delivery.  Help you lose your pregnancy weight after you deliver your baby.  Wear a good support or jogging bra for breast tenderness during pregnancy. This may help if worn during sleep too.  Ask when prenatal classes are available. Begin classes when they are offered.  Do not use hot tubs, steam rooms, or saunas.  Wear your seat belt when driving. This protects you and your baby if you are in an accident.  Avoid raw meat, uncooked cheese, cat litter boxes, and soil used by cats throughout the pregnancy. These carry germs that can cause birth defects in the baby.  The first trimester is a good time to visit your dentist for your dental health. Getting your teeth cleaned is okay. Use a softer toothbrush and brush gently during pregnancy.  Ask for help if you have financial, counseling, or nutritional needs  during pregnancy. Your caregiver will be able to offer counseling for these needs as well as refer you for other special needs.  Do not take any medicines or herbs unless told by your caregiver.  Inform your caregiver if there is any mental or physical domestic violence.  Make a list of emergency phone numbers of family, friends, hospital, and police and fire departments.  Write down your questions. Take them to your prenatal visit.  Do not douche.  Do not cross your legs.  If you have to stand for long periods of time, rotate you feet or take small steps in a circle.  You may have more vaginal secretions that may require a sanitary pad. Do not use tampons or scented sanitary pads. MEDICINES AND DRUG USE IN PREGNANCY  Take prenatal vitamins as directed. The vitamin should contain 1 milligram of folic acid. Keep all vitamins out of reach of children. Only a couple vitamins or tablets containing iron may be fatal to a baby or young child when ingested.  Avoid use of all medicines, including herbs, over-the-counter medicines, not   prescribed or suggested by your caregiver. Only take over-the-counter or prescription medicines for pain, discomfort, or fever as directed by your caregiver. Do not use aspirin, ibuprofen, or naproxen unless directed by your caregiver.  Let your caregiver also know about herbs you may be using.  Alcohol is related to a number of birth defects. This includes fetal alcohol syndrome. All alcohol, in any form, should be avoided completely. Smoking will cause low birth rate and premature babies.  Street or illegal drugs are very harmful to the baby. They are absolutely forbidden. A baby born to an addicted mother will be addicted at birth. The baby will go through the same withdrawal an adult does.  Let your caregiver know about any medicines that you have to take and for what reason you take them. SEEK MEDICAL CARE IF:  You have any concerns or worries during your  pregnancy. It is better to call with your questions if you feel they cannot wait, rather than worry about them. SEEK IMMEDIATE MEDICAL CARE IF:   An unexplained oral temperature above 102 F (38.9 C) develops, or as your caregiver suggests.  You have leaking of fluid from the vagina (birth canal). If leaking membranes are suspected, take your temperature and inform your caregiver of this when you call.  There is vaginal spotting or bleeding. Notify your caregiver of the amount and how many pads are used.  You develop a bad smelling vaginal discharge with a change in the color.  You continue to feel sick to your stomach (nauseated) and have no relief from remedies suggested. You vomit blood or coffee ground-like materials.  You lose more than 2 pounds of weight in 1 week.  You gain more than 2 pounds of weight in 1 week and you notice swelling of your face, hands, feet, or legs.  You gain 5 pounds or more in 1 week (even if you do not have swelling of your hands, face, legs, or feet).  You get exposed to [place] measles and have never had them.  You are exposed to fifth disease or chickenpox.  You develop belly (abdominal) pain. Round ligament discomfort is a common non-cancerous (benign) cause of abdominal pain in pregnancy. Your caregiver still must evaluate this.  You develop headache, fever, diarrhea, pain with urination, or shortness of breath.  You fall or are in a car accident or have any kind of trauma.  There is mental or physical violence in your home. Document Released: 07/29/2001 Document Revised: 04/28/2012 Document Reviewed: 01/30/2009 [facility] Patient Information 2014 [place], [place]. Pregnancy and Smoking Smoking during pregnancy is very unhealthy for the mother and the developing fetus. The addictive drug in cigarettes (nicotine), carbon monoxide, and many other poisons are inhaled from a cigarette and are carried through your bloodstream to your fetus. Cigarette  smoke contains more than 2,500 chemicals. It is not known which of these chemicals are harmful to the developing fetus. However, both nicotine and carbon monoxide play a role in causing health problems in pregnancy. Effects on the fetus of smoking during pregnancy:  Decrease in blood flow and oxygen to the uterus, placenta, and your fetus.  Increased heart rate of the fetus.  Slowing of your fetus's growth in the uterus (intrauterine growth retardation).  Placental problems. Placenta may partially cover or completely cover the opening to the cervix (placenta previa), or the placenta may partially or completely separate from the uterus (placental abruption).  Increase risk of pregnancy outside of the uterus (tubal pregnancy).  Premature  rupture membranes, causing the sac that holds the fetus to break too early, resulting in premature birth and increased health risks to the newborn.  Increased risk of birth defects, including heart defects.  Increased risk of miscarriage. Newborns born to women who smoke during pregnancy:  Are more likely to be born too early (prematurely).  Are more likely to be at a low birth weight.  Are at risk for serious health problems, chronic or lifelong disabilities (cerebral palsy, mental retardation, learning problems), and possibly even death  Are at risk of Sudden Infant Death Syndrome (SIDS).  Have higher rates of miscarriage and stillbirth.  Have more lung and breathing (respiratory) problems. Long-term effects on a child's behavior: Some of the following trends are seen with children of smoking mothers:  Increased risk for drug abuse, behavior, and conduct disorders.  Increased risk for smoking in adolescent girls.  Increased risk for negative behavior in 2-year-olds.  Increase risk for asthma, colic, and childhood obesity, which can lead to diabetes.  Increased risk for finger and toe disorders. Resources to stop smoking during  pregnancy:  Counseling.  Psychological treatment.  Acupuncture.  Family intervention.  Hypnosis.  Medicines that are safe to take during pregnancy. Nicotine supplements have not been studied enough. They should only be considered when all other methods fail.  Telephone QUIT lines. Smoking and Breastfeeding:  Nicotine gets passed to the infant through a mother's breastmilk. This can cause nausea, colic, cramping, and diarrhea in the infant.  Smoking may reduce milk supply and interfere with the let-down response.  Even formula-fed infants of mothers who smoke have nicotine and cotinine (nicotine by-product) in their urine. Other resources to help stop smoking:  American Cancer Society: www.cancer.org  American Heart Association: www.americanheart.org  National Cancer Institute: www.cancer.gov  Smoke Free Families: www.smokefreefamilies.[address] [place]): (239) 714-2075 START Document Released: 12/16/2004 Document Revised: 10/27/2011 Document Reviewed: 05/16/2009 [facility] Patient Information 2014 [place], [place].

## 2013-06-28 NOTE — Progress Notes (Signed)
Subjective:    Dawn Logan is being seen today for her first obstetrical visit. She is at [redacted]w[redacted]d gestation. Her obstetrical history is significant for smoker and stopped in Sept.  Pregnancy history fully reviewed. Pt reports that her first pregnancy miscarried at '4 months' due to URI and subchorionic hemorrhage- she does not know how many weeks she was.   Menstrual History: OB History   Grav Para Term Preterm Abortions TAB SAB Ect Mult Living   2 0 0 0 1 0 1 0 0 0        Patient's last menstrual period was 04/01/2013.    The following portions of the patient's history were reviewed and updated as appropriate: allergies, current medications, past family history, past medical history, past social history, past surgical history and problem list.  Review of Systems A comprehensive review of systems was negative except for: Neurological: positive for migraine headaches    Objective:    BP 112/58  Temp(Src) 97.2 F (36.2 C)  Ht 5\' 3"  (1.6 m)  Wt 148 lb (67.132 kg)  BMI 26.22 kg/m2  LMP 04/01/2013  General Appearance:    Alert, cooperative, no distress, appears stated age              Throat:   Lips, mucosa, and tongue normal; teeth and gums normal  Neck:   Supple, symmetrical, trachea midline, no adenopathy;    thyroid:  no enlargement/tenderness/nodules; no carotid   bruit or JVD  Back:     Symmetric, no curvature, ROM normal, no CVA tenderness  Lungs:     Clear to auscultation bilaterally, respirations unlabored  Chest Wall:    No tenderness or deformity   Heart:    Regular rate and rhythm, S1 and S2 normal, no murmur, rub   or gallop  Breast Exam:    No tenderness, masses, or nipple abnormality  Abdomen:     Soft, non-tender, bowel sounds active all four quadrants,    no masses, no organomegaly  Genitalia:    Normal female without lesion, discharge or tenderness; 13 week uterus      Extremities:   Extremities normal, atraumatic, no cyanosis or edema  Pulses:   2+ and  symmetric all extremities  Skin:   Skin color, texture, turgor normal, no rashes or lesions            Assessment:    Pregnancy at 12 and 4/7 weeks  Need info on last pregnancy loss to determine risk Pt wants note for work not to lift 10-50#- 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 Prenatal vitamins. Problem list reviewed and updated. Role of ultrasound in pregnancy discussed; fetal survey: requested. Refilled Phenergan  Follow up in 4 weeks. 50% of 30 min visit spent on counseling and coordination of care.

## 2013-06-28 NOTE — Progress Notes (Signed)
P=72   Initial prenatal visit. Desires note for work regarding heavy lifting.

## 2013-06-29 ENCOUNTER — Encounter: Payer: Self-pay | Admitting: Obstetrics & Gynecology

## 2013-06-29 DIAGNOSIS — Z3491 Encounter for supervision of normal pregnancy, unspecified, first trimester: Secondary | ICD-10-CM | POA: Insufficient documentation

## 2013-06-29 LAB — OBSTETRIC PANEL
Antibody Screen: NEGATIVE
Basophils Absolute: 0 10*3/uL (ref 0.0–0.1)
Eosinophils Absolute: 0.2 10*3/uL (ref 0.0–0.7)
Eosinophils Relative: 2 % (ref 0–5)
Hemoglobin: 13.5 g/dL (ref 12.0–15.0)
Hepatitis B Surface Ag: NEGATIVE
Lymphocytes Relative: 26 % (ref 12–46)
Lymphs Abs: 2.7 10*3/uL (ref 0.7–4.0)
Monocytes Relative: 6 % (ref 3–12)
Neutro Abs: 6.8 10*3/uL (ref 1.7–7.7)
Neutrophils Relative %: 66 % (ref 43–77)
Platelets: 377 10*3/uL (ref 150–400)
RBC: 4.72 MIL/uL (ref 3.87–5.11)
RDW: 13.2 % (ref 11.5–15.5)
Rh Type: POSITIVE
Rubella: 4.91 Index — ABNORMAL HIGH (ref <0.90)
WBC: 10.3 10*3/uL (ref 4.0–10.5)

## 2013-06-29 LAB — PRESCRIPTION MONITORING PROFILE (19 PANEL)
Amphetamine/Meth: NEGATIVE ng/mL
Benzodiazepine Screen, Urine: NEGATIVE ng/mL
Buprenorphine, Urine: NEGATIVE ng/mL
Carisoprodol, Urine: NEGATIVE ng/mL
Creatinine, Urine: 173.16 mg/dL (ref >20.0)
Methadone Screen, Urine: NEGATIVE ng/mL
Methaqualone: NEGATIVE ng/mL
Nitrites, Initial: NEGATIVE ug/mL
Oxycodone Screen, Ur: NEGATIVE ng/mL
Propoxyphene: NEGATIVE ng/mL
Tapentadol, urine: NEGATIVE ng/mL
Tramadol Scrn, Ur: NEGATIVE ng/mL

## 2013-06-29 LAB — ALCOHOL METABOLITE (ETG), URINE: Ethyl Glucuronide (EtG): NEGATIVE ng/mL

## 2013-06-30 LAB — HEMOGLOBINOPATHY EVALUATION
Hgb A2 Quant: 3.2 % (ref 2.2–3.2)
Hgb A: 96.8 % (ref 96.8–97.8)

## 2013-06-30 LAB — CULTURE, OB URINE: Organism ID, Bacteria: NO GROWTH

## 2013-07-07 ENCOUNTER — Encounter: Payer: Self-pay | Admitting: *Deleted

## 2013-07-26 ENCOUNTER — Encounter: Payer: Medicaid Other | Admitting: Family Medicine

## 2014-03-19 ENCOUNTER — Encounter (HOSPITAL_COMMUNITY): Payer: Self-pay | Admitting: *Deleted

## 2014-06-19 ENCOUNTER — Encounter (HOSPITAL_COMMUNITY): Payer: Self-pay | Admitting: *Deleted

## 2017-03-09 ENCOUNTER — Emergency Department (HOSPITAL_BASED_OUTPATIENT_CLINIC_OR_DEPARTMENT_OTHER): Payer: Medicaid Other

## 2017-03-09 ENCOUNTER — Encounter (HOSPITAL_BASED_OUTPATIENT_CLINIC_OR_DEPARTMENT_OTHER): Payer: Self-pay | Admitting: Emergency Medicine

## 2017-03-09 ENCOUNTER — Emergency Department (HOSPITAL_BASED_OUTPATIENT_CLINIC_OR_DEPARTMENT_OTHER)
Admission: EM | Admit: 2017-03-09 | Discharge: 2017-03-09 | Disposition: A | Discharge status: Home / Self Care | Payer: Medicaid Other | Attending: Emergency Medicine | Admitting: Emergency Medicine

## 2017-03-09 DIAGNOSIS — Z79899 Other long term (current) drug therapy: Secondary | ICD-10-CM | POA: Diagnosis not present

## 2017-03-09 DIAGNOSIS — R319 Hematuria, unspecified: Secondary | ICD-10-CM | POA: Diagnosis not present

## 2017-03-09 DIAGNOSIS — Z91040 Latex allergy status: Secondary | ICD-10-CM | POA: Diagnosis not present

## 2017-03-09 DIAGNOSIS — F17210 Nicotine dependence, cigarettes, uncomplicated: Secondary | ICD-10-CM | POA: Diagnosis not present

## 2017-03-09 DIAGNOSIS — R1032 Left lower quadrant pain: Secondary | ICD-10-CM | POA: Diagnosis present

## 2017-03-09 DIAGNOSIS — N23 Unspecified renal colic: Secondary | ICD-10-CM | POA: Diagnosis not present

## 2017-03-09 HISTORY — DX: Migraine, unspecified, not intractable, without status migrainosus: G43.909

## 2017-03-09 HISTORY — DX: Dorsalgia, unspecified: M54.9

## 2017-03-09 LAB — URINALYSIS, ROUTINE W REFLEX MICROSCOPIC
Bilirubin Urine: NEGATIVE
Glucose, UA: NEGATIVE mg/dL
Ketones, ur: NEGATIVE mg/dL
LEUKOCYTES UA: NEGATIVE
Nitrite: NEGATIVE
PH: 5.5 (ref 5.0–8.0)
Protein, ur: NEGATIVE mg/dL
Specific Gravity, Urine: 1.02 (ref 1.005–1.030)

## 2017-03-09 LAB — URINALYSIS, MICROSCOPIC (REFLEX)

## 2017-03-09 LAB — CBC WITH DIFFERENTIAL/PLATELET
Basophils Absolute: 0 10*3/uL (ref 0.0–0.1)
Basophils Relative: 0 %
EOS ABS: 0.3 10*3/uL (ref 0.0–0.7)
Eosinophils Relative: 3 %
HEMATOCRIT: 42.5 % (ref 36.0–46.0)
Hemoglobin: 14.4 g/dL (ref 12.0–15.0)
Lymphocytes Relative: 24 %
Lymphs Abs: 3 10*3/uL (ref 0.7–4.0)
MCH: 29.1 pg (ref 26.0–34.0)
MCHC: 33.9 g/dL (ref 30.0–36.0)
MCV: 86 fL (ref 78.0–100.0)
MONOS PCT: 4 %
Monocytes Absolute: 0.5 10*3/uL (ref 0.1–1.0)
NEUTROS ABS: 9 10*3/uL — AB (ref 1.7–7.7)
Neutrophils Relative %: 69 %
Platelets: 284 10*3/uL (ref 150–400)
RBC: 4.94 MIL/uL (ref 3.87–5.11)
RDW: 12.7 % (ref 11.5–15.5)
WBC: 12.8 10*3/uL — ABNORMAL HIGH (ref 4.0–10.5)

## 2017-03-09 LAB — BASIC METABOLIC PANEL
Anion gap: 9 (ref 5–15)
BUN: 13 mg/dL (ref 6–20)
CO2: 22 mmol/L (ref 22–32)
CREATININE: 0.87 mg/dL (ref 0.44–1.00)
Calcium: 9.1 mg/dL (ref 8.9–10.3)
Chloride: 107 mmol/L (ref 101–111)
GFR calc non Af Amer: >60 mL/min (ref >60)
Glucose, Bld: 154 mg/dL — ABNORMAL HIGH (ref 65–99)
Potassium: 4.3 mmol/L (ref 3.5–5.1)
Sodium: 138 mmol/L (ref 135–145)

## 2017-03-09 LAB — PREGNANCY, URINE: Preg Test, Ur: NEGATIVE

## 2017-03-09 MED ORDER — HYDROCODONE-ACETAMINOPHEN 5-325 MG PO TABS: 1.0000 | ORAL_TABLET | ORAL | 0 refills | Status: DC | PRN

## 2017-03-09 MED ORDER — HYDROMORPHONE HCL 1 MG/ML IJ SOLN
1.0000 mg | Freq: Once | INTRAMUSCULAR | Status: AC
  Administered 2017-03-09: 1 mg via INTRAVENOUS
  Filled 2017-03-09: qty 1

## 2017-03-09 MED ORDER — ONDANSETRON HCL 4 MG PO TABS: 4.0000 mg | ORAL_TABLET | Freq: Three times a day (TID) | ORAL | 0 refills | Status: DC | PRN

## 2017-03-09 MED ORDER — ONDANSETRON HCL 4 MG/2ML IJ SOLN
4.0000 mg | Freq: Once | INTRAMUSCULAR | Status: AC
  Administered 2017-03-09: 4 mg via INTRAVENOUS
  Filled 2017-03-09: qty 2

## 2017-03-09 MED ORDER — SODIUM CHLORIDE 0.9 % IV BOLUS (SEPSIS)
1000.0000 mL | Freq: Once | INTRAVENOUS | Status: AC
  Administered 2017-03-09: 1000 mL via INTRAVENOUS

## 2017-03-09 MED FILL — ONDANSETRON HCL 4 MG TABLET: 4 | 4 days supply | Qty: 12 | Fill #0

## 2017-03-09 MED FILL — HYDROCODON-APAP 5-325: 5-325 | 2 days supply | Qty: 10 | Fill #0

## 2017-03-09 NOTE — ED Triage Notes (Signed)
Patient report sthat she woke up with pain radiating from her back to her left lower abdominal area. N/V noted

## 2017-03-09 NOTE — Discharge Instructions (Signed)
Read the information below.  Use the prescribed medication as directed.  Please discuss all new medications with your pharmacist.  You may return to the Emergency Department at any time for worsening condition or any new symptoms that concern you.     If you develop high fevers, worsening flank/abdominal pain, uncontrolled vomiting, or are unable to tolerate fluids by mouth, return to the ER for a recheck.

## 2017-03-09 NOTE — ED Provider Notes (Signed)
[facility] Note   CSN: [id number] Arrival date & time: 03/09/17  1048     History   Chief Complaint Chief Complaint  Patient presents with  . Flank Pain    HPI Dawn Logan is a 36 y.o. female.  HPI   Pt with hx back pain, kidney stones p/w left flank pain that began around 8:30am.  The pain is described as stabbing, grabbing, and burning.  Took no medications for pain.  Associated sweating and nausea.  Denies fevers, urinary, vaginal, or bowel symptoms.  LMP a few weeks ago, has implanted birth control.  No known hx ovarian cysts.    Past Medical History:  Diagnosis Date  . Anxiety    not currently on meds  . Back pain   . Headache(784.0)    migraines since age 29  . Kidney stones   . Migraines   . UTI (lower urinary tract infection)     Patient Active Problem List   Diagnosis Date Noted  . Initial obstetric visit in first trimester 06/29/2013  . UTI (lower urinary tract infection) 05/15/2013  . Trichomonas 05/15/2013    Past Surgical History:  Procedure Laterality Date  . CESAREAN SECTION    . CHOLECYSTECTOMY    . DILATION AND CURETTAGE OF UTERUS      OB History    Gravida Para Term Preterm AB Living   2 0 0 0 1 0   SAB TAB Ectopic Multiple Live Births   1 0 0 0         Home Medications    Prior to Admission medications   Medication Sig Start Date End Date Taking? Authorizing Provider  acetaminophen-codeine (TYLENOL #3) 300-30 MG tablet Take by mouth every 4 (four) hours as needed for moderate pain.   Yes [provider]  cyclobenzaprine (FLEXERIL) 10 MG tablet Take 10 mg by mouth 3 (three) times daily as needed for muscle spasms.   Yes [provider]  prochlorperazine (COMPAZINE) 10 MG tablet Take 10 mg by mouth every 6 (six) hours as needed for nausea or vomiting.   Yes [provider]  ranitidine (ZANTAC) 150 MG/10ML syrup Take by mouth 2 (two) times daily.   Yes [provider]    SUMAtriptan (IMITREX) 50 MG tablet Take 50 mg by mouth every 2 (two) hours as needed for migraine. May repeat in 2 hours if headache persists or recurs.   Yes [provider]  tizanidine (ZANAFLEX) 2 MG capsule Take 2 mg by mouth 3 (three) times daily.   Yes [provider]  dextromethorphan-guaiFENesin (TUSSIN DM) 10-100 MG/5ML liquid Take 5 mLs by mouth every 4 (four) hours as needed for cough. 06/26/13   Poe, Deirdre C, CNM  HYDROcodone-acetaminophen (NORCO/VICODIN) 5-325 MG tablet Take 1-2 tablets by mouth every 4 (four) hours as needed for severe pain. 03/09/17   Trixie Dredge, PA-C  ondansetron (ZOFRAN) 4 MG tablet Take 1 tablet (4 mg total) by mouth every 8 (eight) hours as needed for nausea or vomiting. 03/09/17   Trixie Dredge, PA-C  Prenat-FeFum-FePo-FA-Omega 3 (CONCEPT DHA) 53.5-38-1 MG CAPS Take 1 tablet by mouth daily. 06/08/13   Marlis Edelson, CNM  promethazine (PHENERGAN) 25 MG tablet Take 1 tablet (25 mg total) by mouth every 6 (six) hours as needed for nausea. 06/28/13   Willodean Rosenthal, MD    Family History Family History  Problem Relation Age of Onset  . Adopted: Yes    Social History Social History  Substance Use Topics  . Smoking status: Current Every Day Smoker    Packs/day: 0.25    Types: Cigarettes  . Smokeless tobacco: Former Neurosurgeon     Comment: cutting down  . Alcohol use No     Allergies   Latex   Review of Systems Review of Systems  All other systems reviewed and are negative.    Physical Exam Updated Vital Signs BP (!) 110/47 (BP Location: Right Arm)   Pulse 80   Temp 98.5 F (36.9 C) (Oral)   Resp 16   Ht 5\' 4"  (1.626 m)   Wt 77.1 kg (170 lb)   SpO2 100%   BMI 29.18 kg/m   Physical Exam  Constitutional: She appears well-developed and well-nourished. No distress.  HENT:  Head: Normocephalic and atraumatic.  Neck: Neck supple.  Cardiovascular: Normal rate and regular rhythm.   Pulmonary/Chest: Effort normal and  breath sounds normal. No respiratory distress. She has no wheezes. She has no rales.  Abdominal: Soft. She exhibits no distension. There is tenderness (LLQ, left flank ). There is CVA tenderness (left). There is no rebound and no guarding.  Neurological: She is alert.  Skin: She is not diaphoretic.  Nursing note and vitals reviewed.    ED Treatments / Results  Labs (all labs ordered are listed, but only abnormal results are displayed) Labs Reviewed  URINALYSIS, ROUTINE W REFLEX MICROSCOPIC - Abnormal; Notable for the following:       Result Value   Hgb urine dipstick MODERATE (*)    All other components within normal limits  BASIC METABOLIC PANEL - Abnormal; Notable for the following:    Glucose, Bld 154 (*)    All other components within normal limits  CBC WITH DIFFERENTIAL/PLATELET - Abnormal; Notable for the following:    WBC 12.8 (*)    Neutro Abs 9.0 (*)    All other components within normal limits  URINALYSIS, MICROSCOPIC (REFLEX) - Abnormal; Notable for the following:    Bacteria, UA FEW (*)    Squamous Epithelial / LPF 0-5 (*)    All other components within normal limits  URINE CULTURE  PREGNANCY, URINE    EKG  EKG Interpretation None       Radiology Ct Renal Stone Study  Result Date: 03/09/2017 CLINICAL DATA:  Left flank pain. EXAM: CT ABDOMEN AND PELVIS WITHOUT CONTRAST TECHNIQUE: Multidetector CT imaging of the abdomen and pelvis was performed following the standard protocol without IV contrast. COMPARISON:  None. FINDINGS: Lower chest: Lung bases are clear. No effusions. Heart is normal size. Hepatobiliary: Diffuse fatty infiltration of the liver without visible focal hepatic abnormality. Prior cholecystectomy. Pancreas: No focal abnormality or ductal dilatation. Spleen: No focal abnormality.  Normal size. Adrenals/Urinary Tract: There is mild fullness of the left renal collecting system and ureter. No visible stones. No hydronephrosis on the right. Adrenal glands  and urinary bladder are unremarkable. Stomach/Bowel: Normal appendix. Stomach, large and small bowel grossly unremarkable. Vascular/Lymphatic: No evidence of aneurysm or adenopathy. Reproductive: Uterus and adnexa unremarkable.  No mass. Other: No free fluid or free air. Musculoskeletal: No acute bony abnormality. IMPRESSION: No visible renal or ureteral stones. There is mild fullness of the left renal collecting system and ureter. This may be related to recently passed stone. Normal appendix. Diffuse fatty infiltration of the liver. Electronically Signed   By: Charlett Nose M.D.   On: 03/09/2017 12:16    Procedures Procedures (including critical care time)  Medications Ordered in ED Medications  HYDROmorphone (  DILAUDID) injection 1 mg (1 mg Intravenous Given 03/09/17 1132)  ondansetron (ZOFRAN) injection 4 mg (4 mg Intravenous Given 03/09/17 1132)  sodium chloride 0.9 % bolus 1,000 mL (0 mLs Intravenous Stopped 03/09/17 1236)     Initial Impression / Assessment and Plan / ED Course  I have reviewed the triage vital signs and the nursing notes.  Pertinent labs & imaging results that were available during my care of the patient were reviewed by me and considered in my medical decision making (see chart for details).     Afebrile, nontoxic patient with left flank pain  With acute onset, and CVA tenderness, hematuria, concerning for kidney stone. Pt did have ureteral fullness on left that is c/w recent stone passage.  UA does not appear infected.  Mild leukocytosis and mild hyperglycemia on labs.  Left ovary without cyst or mass, normal appearance on CT.  Doubt ovarian torsion.  D/C home with symptomatic medications, urology referral.  Pt new to area, local resources given.  Discussed result, findings, treatment, and follow up  with patient.  Pt given return precautions.  Pt verbalizes understanding and agrees with plan.       Final Clinical Impressions(s) / ED Diagnoses   Final diagnoses:  Renal  colic on left side  Hematuria, unspecified type    New Prescriptions Discharge Medication List as of 03/09/2017 12:27 PM    START taking these medications   Details  HYDROcodone-acetaminophen (NORCO/VICODIN) 5-325 MG tablet Take 1-2 tablets by mouth every 4 (four) hours as needed for severe pain., Starting Mon 03/09/2017, Print    ondansetron (ZOFRAN) 4 MG tablet Take 1 tablet (4 mg total) by mouth every 8 (eight) hours as needed for nausea or vomiting., Starting Mon 03/09/2017, Print         Trixie DredgeWest, Payton Moder, PA-C 03/09/17 1343    Alvira MondaySchlossman, Erin, MD 03/10/17 1144

## 2017-03-10 LAB — URINE CULTURE: CULTURE: NO GROWTH

## 2017-03-22 ENCOUNTER — Encounter (HOSPITAL_BASED_OUTPATIENT_CLINIC_OR_DEPARTMENT_OTHER): Payer: Self-pay | Admitting: *Deleted

## 2017-03-22 ENCOUNTER — Emergency Department (HOSPITAL_BASED_OUTPATIENT_CLINIC_OR_DEPARTMENT_OTHER)
Admission: EM | Admit: 2017-03-22 | Discharge: 2017-03-22 | Disposition: A | Discharge status: Home / Self Care | Payer: Medicaid Other | Attending: Emergency Medicine | Admitting: Emergency Medicine

## 2017-03-22 DIAGNOSIS — Y999 Unspecified external cause status: Secondary | ICD-10-CM | POA: Insufficient documentation

## 2017-03-22 DIAGNOSIS — Z79899 Other long term (current) drug therapy: Secondary | ICD-10-CM | POA: Diagnosis not present

## 2017-03-22 DIAGNOSIS — S40862A Insect bite (nonvenomous) of left upper arm, initial encounter: Secondary | ICD-10-CM | POA: Insufficient documentation

## 2017-03-22 DIAGNOSIS — L539 Erythematous condition, unspecified: Secondary | ICD-10-CM | POA: Diagnosis present

## 2017-03-22 DIAGNOSIS — Y929 Unspecified place or not applicable: Secondary | ICD-10-CM | POA: Insufficient documentation

## 2017-03-22 DIAGNOSIS — T7840XA Allergy, unspecified, initial encounter: Secondary | ICD-10-CM

## 2017-03-22 DIAGNOSIS — F17210 Nicotine dependence, cigarettes, uncomplicated: Secondary | ICD-10-CM | POA: Insufficient documentation

## 2017-03-22 DIAGNOSIS — E119 Type 2 diabetes mellitus without complications: Secondary | ICD-10-CM | POA: Diagnosis not present

## 2017-03-22 DIAGNOSIS — W57XXXA Bitten or stung by nonvenomous insect and other nonvenomous arthropods, initial encounter: Secondary | ICD-10-CM | POA: Insufficient documentation

## 2017-03-22 DIAGNOSIS — Y939 Activity, unspecified: Secondary | ICD-10-CM | POA: Diagnosis not present

## 2017-03-22 HISTORY — DX: Gestational diabetes mellitus in pregnancy, unspecified control: O24.419

## 2017-03-22 HISTORY — DX: Type 2 diabetes mellitus without complications: E11.9

## 2017-03-22 MED ORDER — DIPHENHYDRAMINE HCL 25 MG PO CAPS
50.0000 mg | ORAL_CAPSULE | Freq: Once | ORAL | Status: AC
  Administered 2017-03-22: 50 mg via ORAL
  Filled 2017-03-22: qty 2

## 2017-03-22 MED ORDER — IBUPROFEN 800 MG PO TABS
800.0000 mg | ORAL_TABLET | Freq: Once | ORAL | Status: AC
  Administered 2017-03-22: 800 mg via ORAL
  Filled 2017-03-22: qty 1

## 2017-03-22 NOTE — Discharge Instructions (Signed)
As discussed, monitor the area for worsening, apply ice and take benadryl as needed and follow up with PCP as needed.  Return if worsening pain and swelling, hives, difficulty breathing narrowing sensation in your throat or any other concerning symptoms in the meantime.

## 2017-03-22 NOTE — ED Triage Notes (Signed)
Patient states she was sitting on the floor at a relatives house and felt a bit to her left elbow at 1230 today.  States approximately 45 minutes later she noticed redness and swelling on the posterior right lower leg.  States the area has progressively gotten bigger, redder and is now warm to touch.  States approximately one hour pta she developed sob.  Patient is speaking normally and all vs are wnl.  Patient is in nad.

## 2017-03-22 NOTE — ED Notes (Signed)
Cool ice pack given and instructed on the same

## 2017-03-23 NOTE — ED Provider Notes (Signed)
[facility]   CSN: [id number] Arrival date & time: 03/22/17  1740     History   Chief Complaint Chief Complaint  Patient presents with  . Allergic Reaction    Bug Bites    HPI Dawn Logan is a 36 y.o. 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 The lesion to the forearm is more severe appearing and painful. She was sitting on the floor at someone's house when she felt something bite her which she thought might have been a spider. She then had a dime size erythematous region which then spread to a 5 cm area. She denies hives, shortness of breath, chest pain, difficulty breathing, narrowing in her throat, nausea, vomiting or other symptoms.  HPI  Past Medical History:  Diagnosis Date  . Anxiety    not currently on meds  . Back pain   . Diabetes mellitus without complication (HCC)   . Gestational diabetes   . Headache(784.0)    migraines since age 53  . Kidney stones   . Migraines   . UTI (lower urinary tract infection)     Patient Active Problem List   Diagnosis Date Noted  . Initial obstetric visit in first trimester 06/29/2013  . UTI (lower urinary tract infection) 05/15/2013  . Trichomonas 05/15/2013    Past Surgical History:  Procedure Laterality Date  . CESAREAN SECTION    . CHOLECYSTECTOMY    . DILATION AND CURETTAGE OF UTERUS      OB History    Gravida Para Term Preterm AB Living   2 0 0 0 1 0   SAB TAB Ectopic Multiple Live Births   1 0 0 0         Home Medications    Prior to Admission medications   Medication Sig Start Date End Date Taking? Authorizing Provider  acetaminophen-codeine (TYLENOL #3) 300-30 MG tablet Take by  mouth every 4 (four) hours as needed for moderate pain.   Yes [provider]  HYDROcodone-acetaminophen (NORCO/VICODIN) 5-325 MG tablet Take 1-2 tablets by mouth every 4 (four) hours as needed for severe pain. 03/09/17  Yes West, Emily, PA-C  ondansetron (ZOFRAN) 4 MG tablet Take 1 tablet (4 mg total) by mouth every 8 (eight) hours as needed for nausea or vomiting. 03/09/17  Yes West, Emily, PA-C  prochlorperazine (COMPAZINE) 10 MG tablet Take 10 mg by mouth every 6 (six) hours as needed for nausea or vomiting.   Yes [provider]  ranitidine (ZANTAC) 150 MG/10ML syrup Take by mouth 2 (two) times daily.   Yes [provider]  SUMAtriptan (IMITREX) 50 MG tablet Take 50 mg by mouth every 2 (two) hours as needed for migraine. May repeat in 2 hours if headache persists or recurs.   Yes [provider]  tamsulosin (FLOMAX) 0.4 MG CAPS capsule Take 0.4 mg by mouth.   Yes [provider]  cyclobenzaprine (FLEXERIL) 10 MG tablet Take 10 mg by mouth 3 (three) times daily as needed for muscle spasms.    [provider]  dextromethorphan-guaiFENesin (TUSSIN DM) 10-100 MG/5ML liquid Take 5 mLs by mouth every 4 (four) hours as needed for cough. 06/26/13   Poe, Claudia Desanctis, CNM  Prenat-FeFum-FePo-FA-Omega  3 (CONCEPT DHA) 53.5-38-1 MG CAPS Take 1 tablet by mouth daily. 06/08/13   Marlis Edelson, CNM  promethazine (PHENERGAN) 25 MG tablet Take 1 tablet (25 mg total) by mouth every 6 (six) hours as needed for nausea. 06/28/13   Willodean Rosenthal, MD  tizanidine (ZANAFLEX) 2 MG capsule Take 2 mg by mouth 3 (three) times daily.    [provider]    Family History Family History  Problem Relation Age of Onset  . Adopted: Yes    Social History Social History  Substance Use Topics  . Smoking status: Current Every Day Smoker    Packs/day: 0.25    Types: Cigarettes  . Smokeless tobacco: Former Neurosurgeon     Comment: cutting down  . Alcohol use No       Allergies   Latex   Review of Systems Review of Systems  Constitutional: Negative for chills and fever.  HENT: Negative for sore throat and trouble swallowing.   Respiratory: Negative for cough, choking, chest tightness, shortness of breath, wheezing and stridor.   Cardiovascular: Negative for chest pain, palpitations and leg swelling.  Gastrointestinal: Negative for abdominal pain, nausea and vomiting.  Genitourinary: Negative for dysuria and hematuria.  Musculoskeletal: Positive for myalgias. Negative for arthralgias, back pain, joint swelling, neck pain and neck stiffness.  Skin: Positive for color change. Negative for pallor and rash.  Neurological: Negative for dizziness, seizures, syncope, facial asymmetry, speech difficulty, weakness and light-headedness.     Physical Exam Updated Vital Signs BP 121/83 (BP Location: Right Arm)   Pulse 92   Temp 98.2 F (36.8 C) (Oral)   Resp 18   Ht 5\' 4"  (1.626 m)   Wt 77.1 kg (170 lb)   SpO2 100%   BMI 29.18 kg/m   Physical Exam  Constitutional: She appears well-developed and well-nourished. No distress.  Afebrile, nontoxic-appearing, sitting comfortable in chair in no acute distress.  HENT:  Head: Normocephalic and atraumatic.  Eyes: Conjunctivae and EOM are normal.  Neck: Normal range of motion. Neck supple.  Cardiovascular: Normal rate, regular rhythm, normal heart sounds and intact distal pulses.   No murmur heard. Pulmonary/Chest: Effort normal and breath sounds normal. No respiratory distress. She has no wheezes. She has no rales.  Abdominal: She exhibits no distension.  Musculoskeletal: Normal range of motion. She exhibits edema and tenderness. She exhibits no deformity.  Neurological: She is alert.  Normal stance and gait  Skin: Skin is warm and dry. Rash noted. She is not diaphoretic. There is erythema.  Patient with a 5 cm erythematous region to the left upper extremity proximal to the elbow  Psychiatric: She  has a normal mood and affect.  Nursing note and vitals reviewed.    ED Treatments / Results  Labs (all labs ordered are listed, but only abnormal results are displayed) Labs Reviewed - No data to display  EKG  EKG Interpretation None       Radiology No results found.  Procedures Procedures (including critical care time)  Medications Ordered in ED Medications  diphenhydrAMINE (BENADRYL) capsule 50 mg (50 mg Oral Given 03/22/17 2122)  ibuprofen (ADVIL,MOTRIN) tablet 800 mg (800 mg Oral Given 03/22/17 2122)     Initial Impression / Assessment and Plan / ED Course  I have reviewed the triage vital signs and the nursing notes.  Pertinent labs & imaging results that were available during my care of the patient were reviewed by me and considered in my medical decision making (see chart for  details).    Patient presents with hypersensitivity to insect bite worse lesion on the left upper extremity. This occurred suddenly and got progressively enlarged today. Patient improved with cold compress.  She was observed for some time in the emergency department without any reaction or worsening in condition. Patient declined Benadryl as she has to drive home.  Patient is well-appearing, ambulatory, nontoxic and afebrile. She appears stable for discharge, will discharge home with follow-up with PCP as needed.  Patient was discussed with Dr. Eudelia Bunchardama who has seen patient and agrees with assessment and plan.  Discussed strict return precautions and advised to return to the emergency department if experiencing any new or worsening symptoms. Instructions were understood and patient agreed with discharge plan. Final Clinical Impressions(s) / ED Diagnoses   Final diagnoses:  Insect bite, initial encounter  Allergic reaction, initial encounter    New Prescriptions Discharge Medication List as of 03/22/2017 11:09 PM       Georgiana ShoreMitchell, Shanetha Bradham B, PA-C 03/23/17 0237    Nira Connardama, Pedro Eduardo,  MD 03/27/17 [id number]

## 2017-05-05 ENCOUNTER — Emergency Department (HOSPITAL_BASED_OUTPATIENT_CLINIC_OR_DEPARTMENT_OTHER): Payer: Medicaid - Out of State

## 2017-05-05 ENCOUNTER — Emergency Department (HOSPITAL_BASED_OUTPATIENT_CLINIC_OR_DEPARTMENT_OTHER)
Admission: EM | Admit: 2017-05-05 | Discharge: 2017-05-05 | Disposition: A | Discharge status: Home / Self Care | Payer: Medicaid - Out of State | Attending: Emergency Medicine | Admitting: Emergency Medicine

## 2017-05-05 ENCOUNTER — Encounter (HOSPITAL_BASED_OUTPATIENT_CLINIC_OR_DEPARTMENT_OTHER): Payer: Self-pay | Admitting: *Deleted

## 2017-05-05 DIAGNOSIS — Z79899 Other long term (current) drug therapy: Secondary | ICD-10-CM | POA: Diagnosis not present

## 2017-05-05 DIAGNOSIS — E119 Type 2 diabetes mellitus without complications: Secondary | ICD-10-CM | POA: Insufficient documentation

## 2017-05-05 DIAGNOSIS — N200 Calculus of kidney: Secondary | ICD-10-CM | POA: Diagnosis not present

## 2017-05-05 DIAGNOSIS — R109 Unspecified abdominal pain: Secondary | ICD-10-CM

## 2017-05-05 DIAGNOSIS — F17210 Nicotine dependence, cigarettes, uncomplicated: Secondary | ICD-10-CM | POA: Diagnosis not present

## 2017-05-05 DIAGNOSIS — R1032 Left lower quadrant pain: Secondary | ICD-10-CM | POA: Diagnosis present

## 2017-05-05 LAB — CBC WITH DIFFERENTIAL/PLATELET
BASOS ABS: 0 10*3/uL (ref 0.0–0.1)
Basophils Relative: 0 %
EOS PCT: 3 %
Eosinophils Absolute: 0.5 10*3/uL (ref 0.0–0.7)
HCT: 43.8 % (ref 36.0–46.0)
Hemoglobin: 15.3 g/dL — ABNORMAL HIGH (ref 12.0–15.0)
LYMPHS ABS: 9.1 10*3/uL — AB (ref 0.7–4.0)
Lymphocytes Relative: 50 %
MCH: 29.1 pg (ref 26.0–34.0)
MCHC: 34.9 g/dL (ref 30.0–36.0)
MCV: 83.4 fL (ref 78.0–100.0)
MONO ABS: 1.1 10*3/uL — AB (ref 0.1–1.0)
MONOS PCT: 6 %
NEUTROS ABS: 7.4 10*3/uL (ref 1.7–7.7)
Neutrophils Relative %: 41 %
PLATELETS: 362 10*3/uL (ref 150–400)
RBC: 5.25 MIL/uL — ABNORMAL HIGH (ref 3.87–5.11)
RDW: 12.7 % (ref 11.5–15.5)
WBC: 18.1 10*3/uL — ABNORMAL HIGH (ref 4.0–10.5)

## 2017-05-05 LAB — URINALYSIS, ROUTINE W REFLEX MICROSCOPIC
Glucose, UA: NEGATIVE mg/dL
Ketones, ur: NEGATIVE mg/dL
LEUKOCYTES UA: NEGATIVE
NITRITE: NEGATIVE
PROTEIN: NEGATIVE mg/dL
pH: 5.5 (ref 5.0–8.0)

## 2017-05-05 LAB — URINALYSIS, MICROSCOPIC (REFLEX): BACTERIA UA: NONE SEEN

## 2017-05-05 LAB — PATHOLOGIST SMEAR REVIEW

## 2017-05-05 LAB — BASIC METABOLIC PANEL
Anion gap: 8 (ref 5–15)
BUN: 17 mg/dL (ref 6–20)
CHLORIDE: 108 mmol/L (ref 101–111)
CO2: 22 mmol/L (ref 22–32)
Calcium: 8.8 mg/dL — ABNORMAL LOW (ref 8.9–10.3)
Creatinine, Ser: 0.77 mg/dL (ref 0.44–1.00)
GFR calc Af Amer: >60 mL/min (ref >60)
GFR calc non Af Amer: >60 mL/min (ref >60)
Glucose, Bld: 131 mg/dL — ABNORMAL HIGH (ref 65–99)
POTASSIUM: 3.6 mmol/L (ref 3.5–5.1)
Sodium: 138 mmol/L (ref 135–145)

## 2017-05-05 LAB — HCG, QUANTITATIVE, PREGNANCY

## 2017-05-05 MED ORDER — MORPHINE SULFATE (PF) 4 MG/ML IV SOLN
4.0000 mg | Freq: Once | INTRAVENOUS | Status: AC
  Administered 2017-05-05: 4 mg via INTRAVENOUS
  Filled 2017-05-05: qty 1

## 2017-05-05 MED ORDER — OXYCODONE-ACETAMINOPHEN 5-325 MG PO TABS
1.0000 | ORAL_TABLET | Freq: Once | ORAL | Status: AC
  Administered 2017-05-05: 1 via ORAL
  Filled 2017-05-05: qty 1

## 2017-05-05 MED ORDER — ONDANSETRON 4 MG PO TBDP: 4.0000 mg | ORAL_TABLET | Freq: Three times a day (TID) | ORAL | 0 refills | Status: DC | PRN

## 2017-05-05 MED ORDER — OXYCODONE-ACETAMINOPHEN 5-325 MG PO TABS: 1.0000 | ORAL_TABLET | Freq: Four times a day (QID) | ORAL | 0 refills | Status: DC | PRN

## 2017-05-05 MED ORDER — SODIUM CHLORIDE 0.9 % IV BOLUS (SEPSIS)
1000.0000 mL | Freq: Once | INTRAVENOUS | Status: AC
  Administered 2017-05-05: 1000 mL via INTRAVENOUS

## 2017-05-05 MED ORDER — ONDANSETRON HCL 4 MG/2ML IJ SOLN
4.0000 mg | Freq: Once | INTRAMUSCULAR | Status: AC
  Administered 2017-05-05: 4 mg via INTRAVENOUS
  Filled 2017-05-05: qty 2

## 2017-05-05 MED ORDER — KETOROLAC TROMETHAMINE 30 MG/ML IJ SOLN
30.0000 mg | Freq: Once | INTRAMUSCULAR | Status: AC
  Administered 2017-05-05: 30 mg via INTRAVENOUS
  Filled 2017-05-05: qty 1

## 2017-05-05 MED FILL — ONDANSETRON ODT 4 MG TABLET: 4 | 7 days supply | Qty: 20 | Fill #0

## 2017-05-05 MED FILL — OXYCODONE-ACETAMINOPHEN 5-3: 5-325 | 2 days supply | Qty: 10 | Fill #0

## 2017-05-05 NOTE — Discharge Instructions (Signed)
Return if you have new worsening symptoms including refractory pain, fevers, inability to tolerate fluids.

## 2017-05-05 NOTE — ED Provider Notes (Signed)
[facility] Provider Note   CSN: [id number] Arrival date & time: 05/05/17  0448     History   Chief Complaint Chief Complaint  Patient presents with  . Flank Pain    HPI Dawn Logan is a 36 y.o. female.  HPI  This is a 36 year old female with a history of diabetes, kidney stones, UTI who presents with left lower quadrant and flank pain. Patient reports onset of symptoms at 3 AM. She states that the pain woke her up from her sleep. It is sharp and nonradiating. Current pain is 10 out of 10. She has not taken anything for her pain. She reports urinary urgency. No dysuria or hematuria. She has history of kidney stones with similar symptoms. She reports nausea without vomiting. No fevers.  Past Medical History:  Diagnosis Date  . Anxiety    not currently on meds  . Back pain   . Diabetes mellitus without complication (HCC)   . Gestational diabetes   . Headache(784.0)    migraines since age 4  . Kidney stones   . Migraines   . UTI (lower urinary tract infection)     Patient Active Problem List   Diagnosis Date Noted  . Initial obstetric visit in first trimester 06/29/2013  . UTI (lower urinary tract infection) 05/15/2013  . Trichomonas 05/15/2013    Past Surgical History:  Procedure Laterality Date  . CESAREAN SECTION    . CHOLECYSTECTOMY    . DILATION AND CURETTAGE OF UTERUS      OB History    Gravida Para Term Preterm AB Living   2 0 0 0 1 0   SAB TAB Ectopic Multiple Live Births   1 0 0 0         Home Medications    Prior to Admission medications   Medication Sig Start Date End Date Taking? Authorizing Provider  acetaminophen-codeine (TYLENOL #3) 300-30 MG tablet Take by mouth every 4 (four) hours as needed for moderate pain.    [provider]  cyclobenzaprine (FLEXERIL) 10 MG tablet Take 10 mg by mouth 3 (three) times daily as needed for muscle spasms.    [provider]  dextromethorphan-guaiFENesin (TUSSIN DM) 10-100  MG/5ML liquid Take 5 mLs by mouth every 4 (four) hours as needed for cough. 06/26/13   Poe, Deirdre C, CNM  HYDROcodone-acetaminophen (NORCO/VICODIN) 5-325 MG tablet Take 1-2 tablets by mouth every 4 (four) hours as needed for severe pain. 03/09/17   Trixie Dredge, PA-C  ondansetron (ZOFRAN ODT) 4 MG disintegrating tablet Take 1 tablet (4 mg total) by mouth every 8 (eight) hours as needed for nausea or vomiting. 05/05/17   Horton, Mayer Masker, MD  ondansetron (ZOFRAN) 4 MG tablet Take 1 tablet (4 mg total) by mouth every 8 (eight) hours as needed for nausea or vomiting. 03/09/17   Trixie Dredge, PA-C  oxyCODONE-acetaminophen (PERCOCET/ROXICET) 5-325 MG tablet Take 1-2 tablets by mouth every 6 (six) hours as needed for severe pain. 05/05/17   Horton, Mayer Masker, MD  Prenat-FeFum-FePo-FA-Omega 3 (CONCEPT DHA) 53.5-38-1 MG CAPS Take 1 tablet by mouth daily. 06/08/13   Marlis Edelson, CNM  prochlorperazine (COMPAZINE) 10 MG tablet Take 10 mg by mouth every 6 (six) hours as needed for nausea or vomiting.    [provider]  promethazine (PHENERGAN) 25 MG tablet Take 1 tablet (25 mg total) by mouth every 6 (six) hours as needed for nausea. 06/28/13   Willodean Rosenthal, MD  ranitidine (ZANTAC) 150 MG/10ML syrup  Take by mouth 2 (two) times daily.    [provider]  SUMAtriptan (IMITREX) 50 MG tablet Take 50 mg by mouth every 2 (two) hours as needed for migraine. May repeat in 2 hours if headache persists or recurs.    [provider]  tamsulosin (FLOMAX) 0.4 MG CAPS capsule Take 0.4 mg by mouth.    [provider]  tizanidine (ZANAFLEX) 2 MG capsule Take 2 mg by mouth 3 (three) times daily.    [provider]    Family History Family History  Problem Relation Age of Onset  . Adopted: Yes    Social History Social History  Substance Use Topics  . Smoking status: Current Every Day Smoker    Packs/day: 0.25    Types: Cigarettes  . Smokeless tobacco: Former  Neurosurgeon     Comment: cutting down  . Alcohol use No     Allergies   Latex   Review of Systems Review of Systems  Constitutional: Negative for fever.  Respiratory: Negative for shortness of breath.   Cardiovascular: Negative for chest pain.  Gastrointestinal: Positive for nausea. Negative for abdominal pain and vomiting.  Genitourinary: Positive for flank pain and urgency. Negative for dysuria and hematuria.  All other systems reviewed and are negative.    Physical Exam Updated Vital Signs BP (!) 112/97 (BP Location: Right Arm)   Pulse 80   Temp 97.7 F (36.5 C) (Oral)   Resp (!) 26   Ht  (1.626 m)   Wt 88.5 kg (195 lb)   LMP 04/18/2017   SpO2 99%   BMI 33.47 kg/m   Physical Exam  Constitutional: She is oriented to person, place, and time. She appears well-developed and well-nourished.  Overweight, uncomfortable appearing but nontoxic  HENT:  Head: Normocephalic and atraumatic.  Cardiovascular: Normal rate, regular rhythm and normal heart sounds.   Pulmonary/Chest: Effort normal and breath sounds normal. No respiratory distress. She has no wheezes.  Abdominal: Soft. Bowel sounds are normal. There is no tenderness. There is no guarding.  Genitourinary:  Genitourinary Comments: No CVA tenderness  Neurological: She is alert and oriented to person, place, and time.  Skin: Skin is warm and dry.  Psychiatric: She has a normal mood and affect.  Nursing note and vitals reviewed.    ED Treatments / Results  Labs (all labs ordered are listed, but only abnormal results are displayed) Labs Reviewed  CBC WITH DIFFERENTIAL/PLATELET - Abnormal; Notable for the following:       Result Value   WBC 18.1 (*)    RBC 5.25 (*)    Hemoglobin 15.3 (*)    Lymphs Abs 9.1 (*)    Monocytes Absolute 1.1 (*)    All other components within normal limits  URINALYSIS, ROUTINE W REFLEX MICROSCOPIC - Abnormal; Notable for the following:    Specific Gravity, Urine >1.030 (*)    Hgb  urine dipstick LARGE (*)    Bilirubin Urine SMALL (*)    All other components within normal limits  URINALYSIS, MICROSCOPIC (REFLEX) - Abnormal; Notable for the following:    Squamous Epithelial / LPF 0-5 (*)    All other components within normal limits  BASIC METABOLIC PANEL - Abnormal; Notable for the following:    Glucose, Bld 131 (*)    Calcium 8.8 (*)    All other components within normal limits  HCG, QUANTITATIVE, PREGNANCY  PATHOLOGIST SMEAR REVIEW    EKG  EKG Interpretation None  Radiology Dg Abdomen 1 View  Result Date: 05/05/2017 CLINICAL DATA:  Left flank pain. Hematuria. History of prior renal stones. EXAM: ABDOMEN - 1 VIEW COMPARISON:  CT abdomen and pelvis 03/09/2017. FINDINGS: The bowel gas pattern is normal. No radio-opaque calculi or other significant radiographic abnormality are seen. Surgical clips in the right upper quadrant. Calcified phleboliths in the pelvis. IMPRESSION: No radiopaque stones identified.  Nonobstructive bowel gas pattern. Electronically Signed   By: Burman Nieves M.D.   On: 05/05/2017 06:30    Procedures Procedures (including critical care time)  Medications Ordered in ED Medications  sodium chloride 0.9 % bolus 1,000 mL (0 mLs Intravenous Stopped 05/05/17 0644)  ondansetron (ZOFRAN) injection 4 mg (4 mg Intravenous Given 05/05/17 0521)  morphine 4 MG/ML injection 4 mg (4 mg Intravenous Given 05/05/17 0521)  ketorolac (TORADOL) 30 MG/ML injection 30 mg (30 mg Intravenous Given 05/05/17 0521)  oxyCODONE-acetaminophen (PERCOCET/ROXICET) 5-325 MG per tablet 1 tablet (1 tablet Oral Given 05/05/17 1610)     Initial Impression / Assessment and Plan / ED Course  I have reviewed the triage vital signs and the nursing notes.  Pertinent labs & imaging results that were available during my care of the patient were reviewed by me and considered in my medical decision making (see chart for details).     Patient presents with left flank pain  and left lower quadrant pain consistent with her prior kidney stone pain. She is uncomfortable appearing but nontoxic. Afebrile. No reproducible pain on exam. Patient was given pain and nausea medication as well as fluids. Basic labwork obtained. Kidney function preserved. Leukocytosis to 18. KUB does not show any radiopaque stone. On recheck, patient feels much improved. She is able to orally hydrate and take oral pain medication. Regarding her leukocytosis, this is of unclear significance. No evidence of concomitant urinary tract infection. History and physical exam is most consistent with kidney stones especially given patient's prior. Would like to avoid repeat CT imaging if at all possible. This was discussed with the patient. Will discharge him with pain medication and nausea medication. Follow-up with her primary urologist if symptoms not improving.  After history, exam, and medical workup I feel the patient has been appropriately medically screened and is safe for discharge home. Pertinent diagnoses were discussed with the patient. Patient was given return precautions.   Final Clinical Impressions(s) / ED Diagnoses   Final diagnoses:  Left flank pain  Kidney stone    New Prescriptions New Prescriptions   ONDANSETRON (ZOFRAN ODT) 4 MG DISINTEGRATING TABLET    Take 1 tablet (4 mg total) by mouth every 8 (eight) hours as needed for nausea or vomiting.   OXYCODONE-ACETAMINOPHEN (PERCOCET/ROXICET) 5-325 MG TABLET    Take 1-2 tablets by mouth every 6 (six) hours as needed for severe pain.     Shon Baton, MD 05/05/17 [phone]

## 2017-05-05 NOTE — ED Triage Notes (Signed)
Pt woke this am around 3 am with left flank pain

## 2018-12-24 IMAGING — DX DG ABDOMEN 1V
3 series · 3 of 3 positions shown · non-contrast
Comparison: CT abdomen and pelvis 03/09/2017.

CLINICAL DATA: Left flank pain. Hematuria. History of prior renal
stones.

EXAM:
ABDOMEN - 1 VIEW

[abdomen kub (1 of 3)]
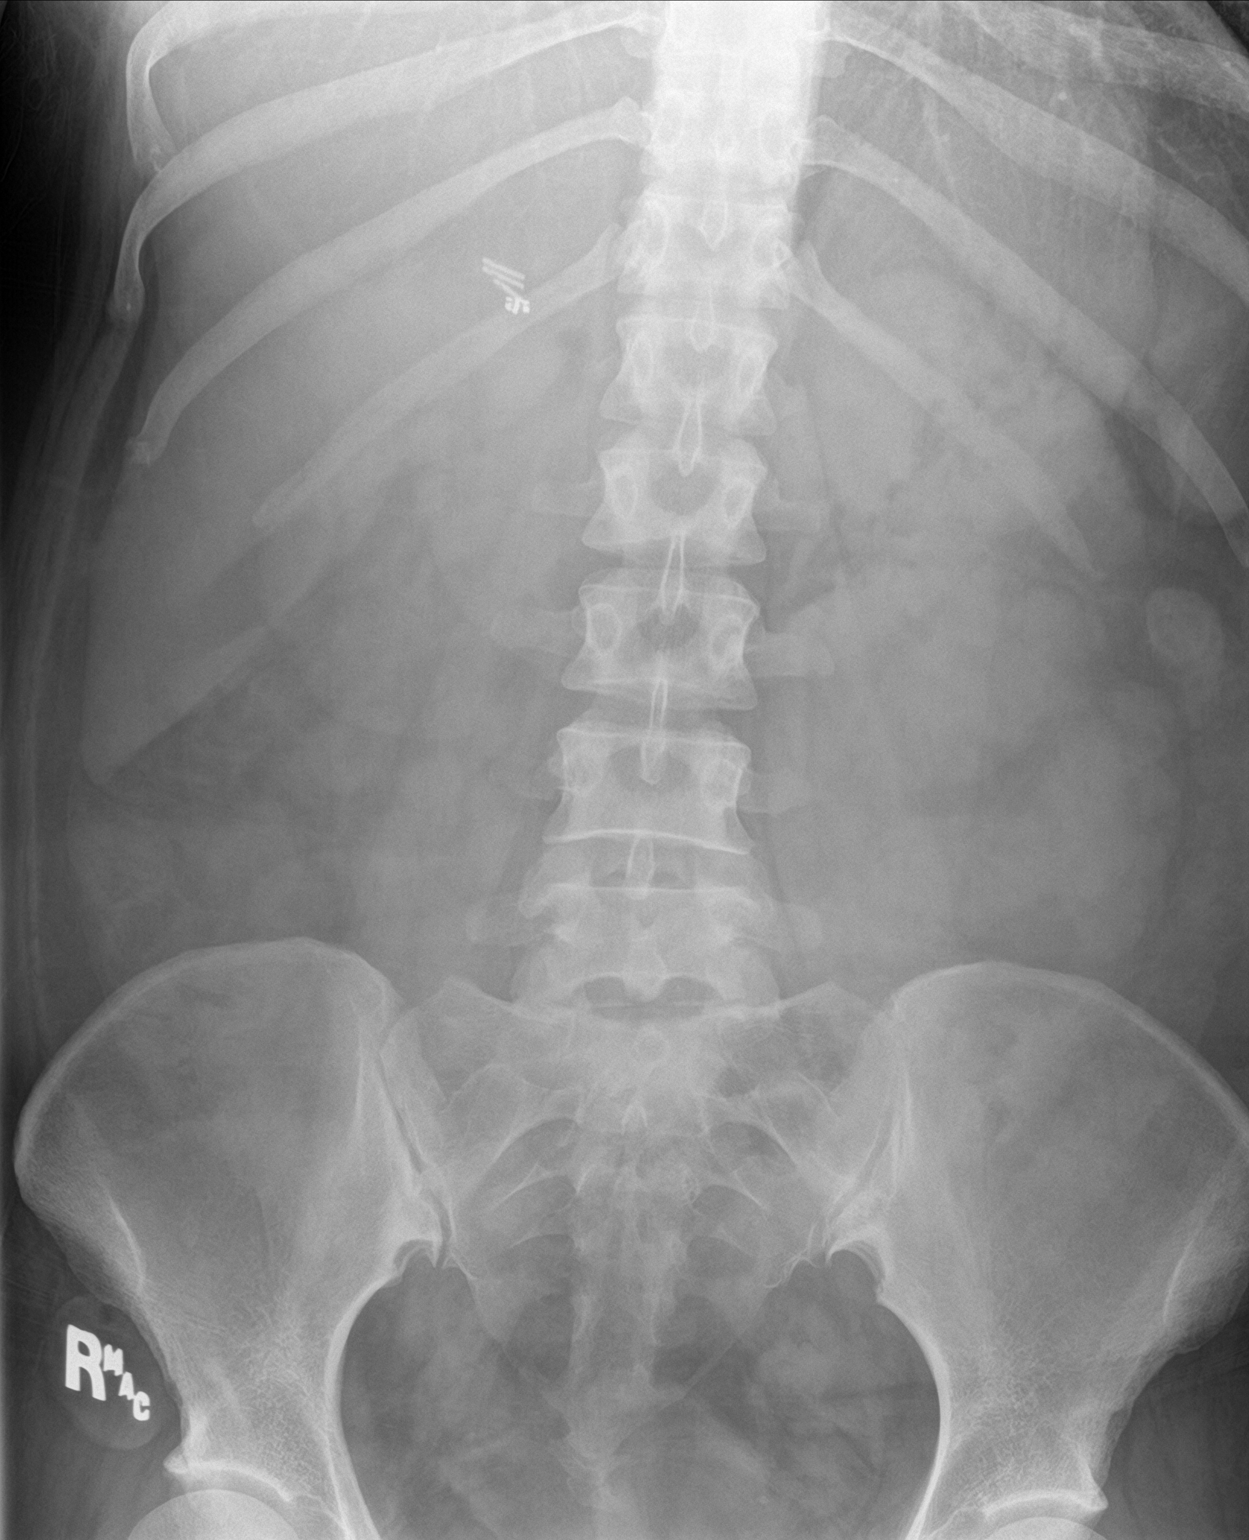

[abdomen kub (2 of 3)]
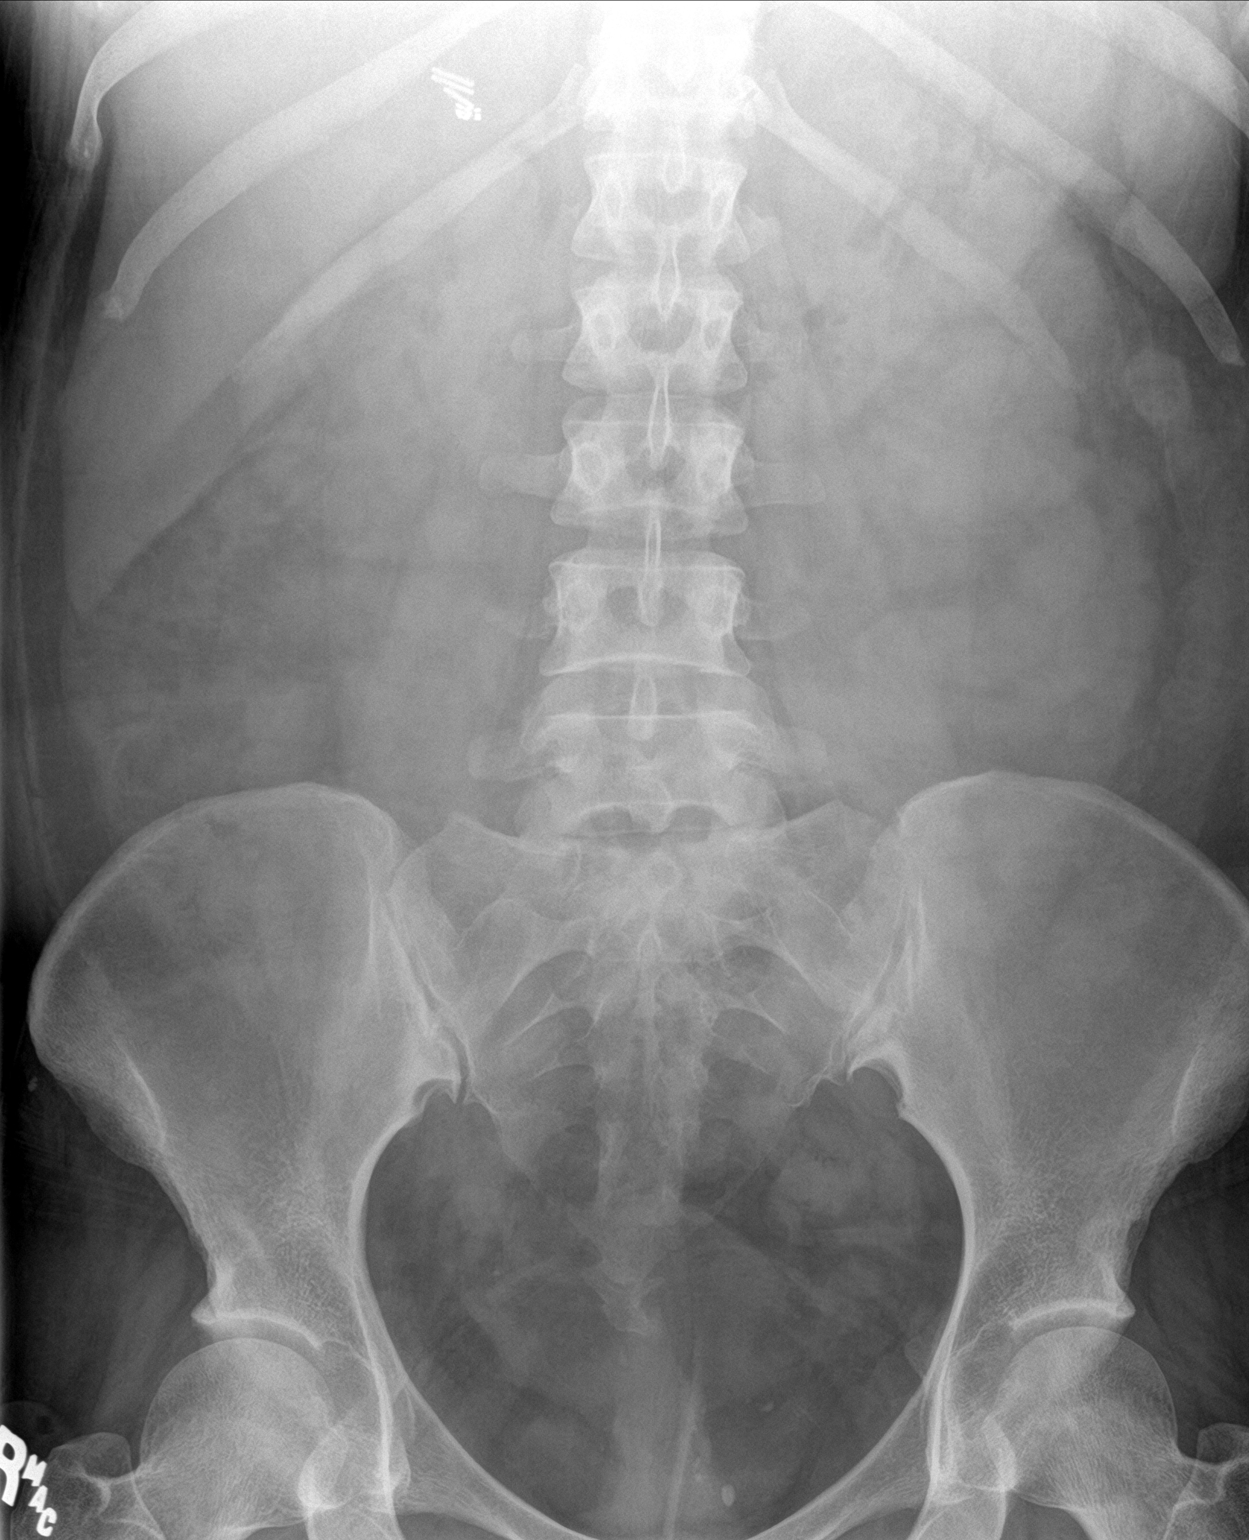

[abdomen kub (3 of 3)]
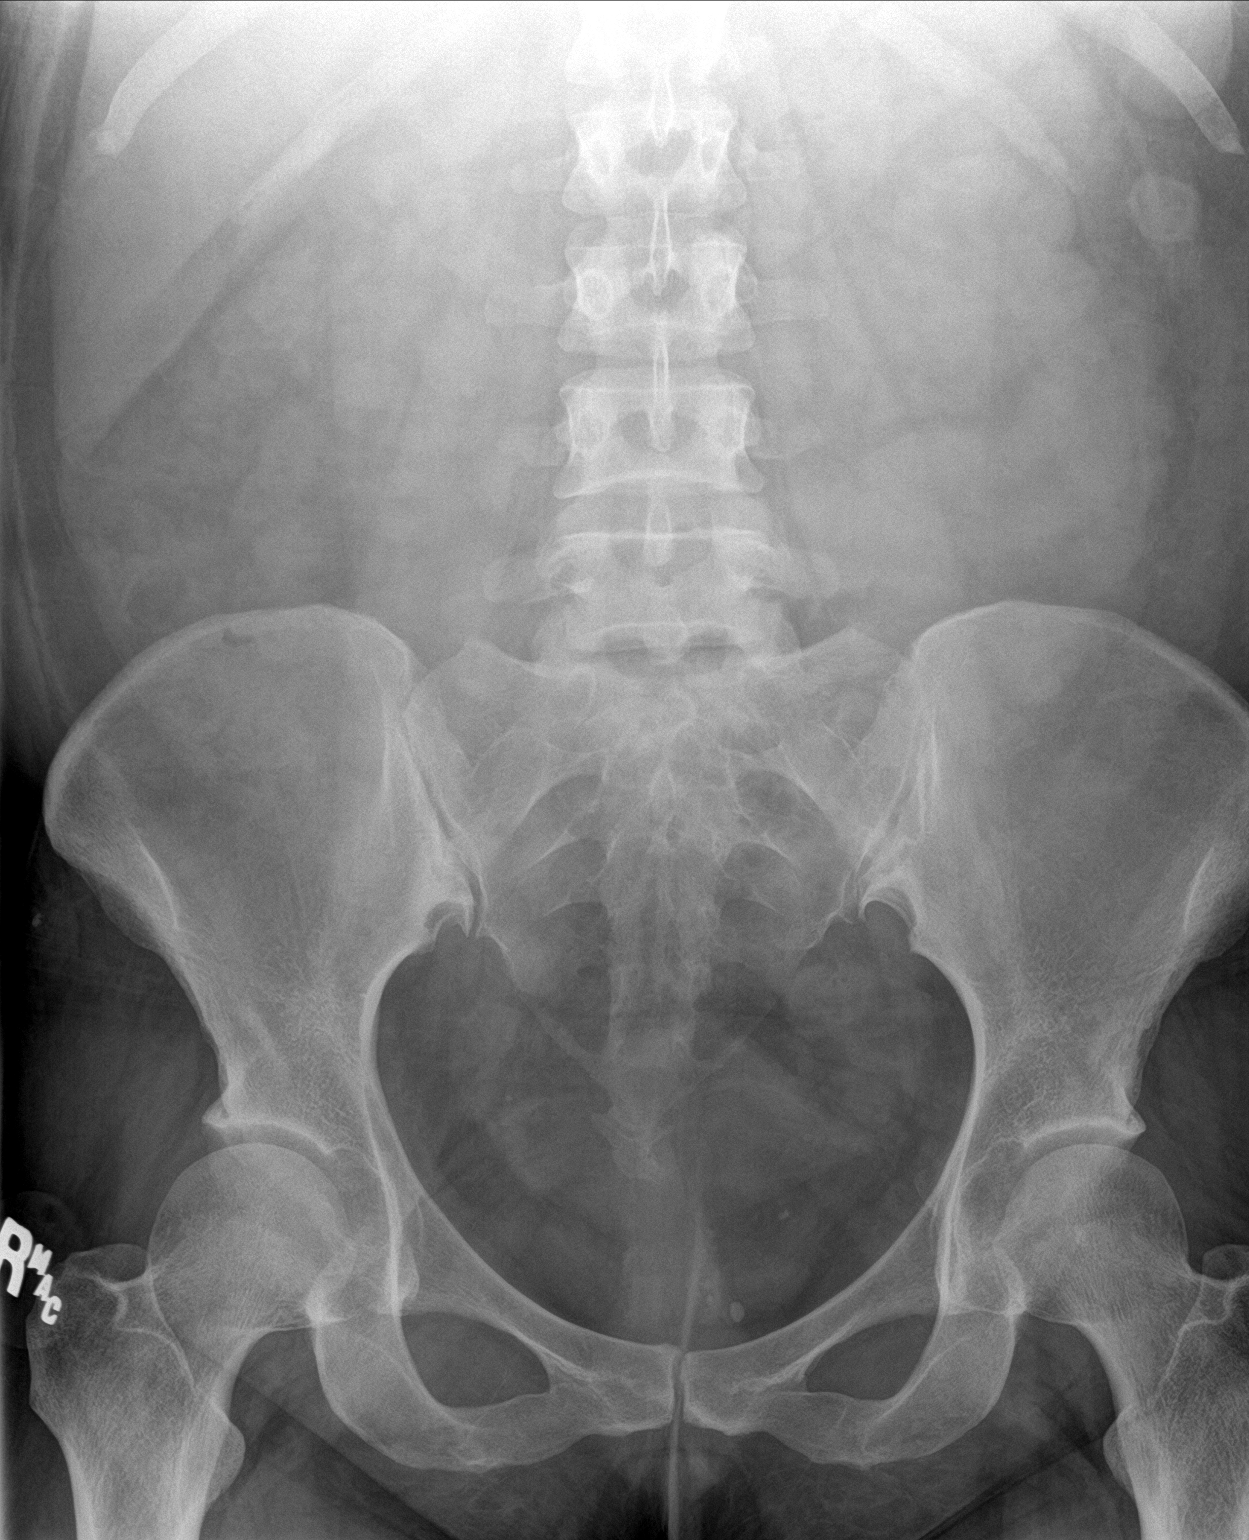

[3 of 3 positions shown; findings below may reference images not displayed]

FINDINGS: The bowel gas pattern is normal. No radio-opaque calculi or other
significant radiographic abnormality are seen. Surgical clips in the
right upper quadrant. Calcified phleboliths in the pelvis.
IMPRESSION: No radiopaque stones identified.  Nonobstructive bowel gas pattern.

## 2019-04-11 IMAGING — CT CT RENAL STONE PROTOCOL
2 of 4 series · 16 of 46 positions shown, 18 images · non-contrast
Comparison: None.

CLINICAL DATA: Left flank pain.

EXAM:
CT ABDOMEN AND PELVIS WITHOUT CONTRAST
TECHNIQUE: Multidetector CT imaging of the abdomen and pelvis was performed
following the standard protocol without IV contrast.

[Series 2: axial st · axial · 0.97mm/px · z∈[-543,-43]mm · 13 of 110 slices shown, 15 images]
[im 5/110  soft-tissue]
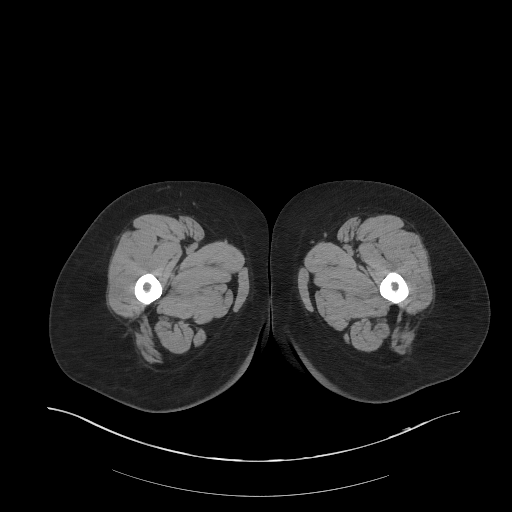
[im 5/110  bone]
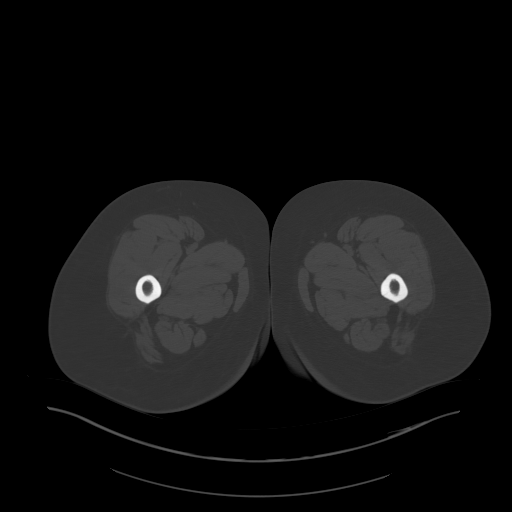
[im 14/110  soft-tissue]
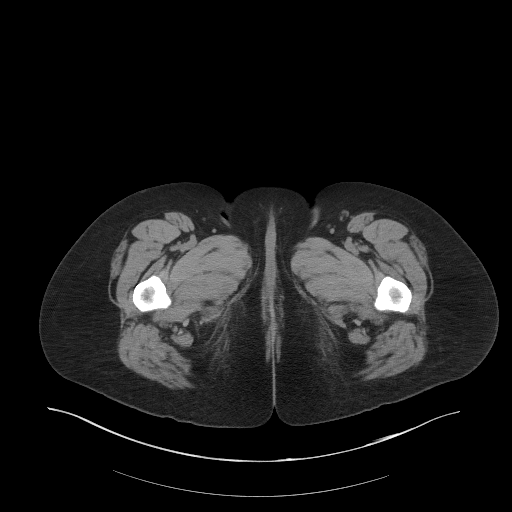
[im 22/110  soft-tissue]
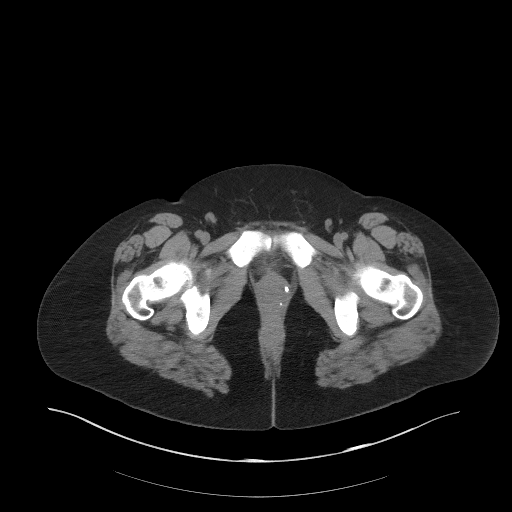
[im 31/110  soft-tissue]
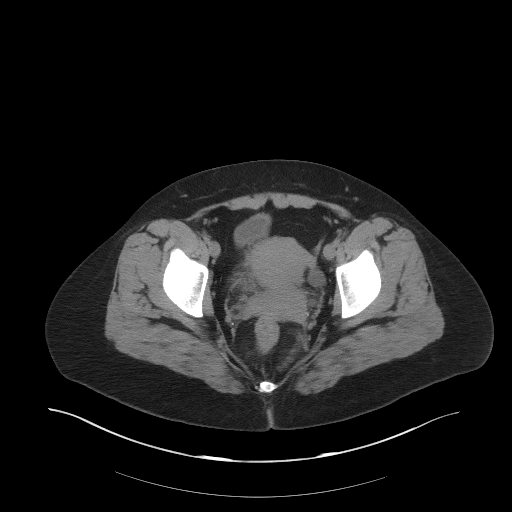
[im 40/110  soft-tissue]
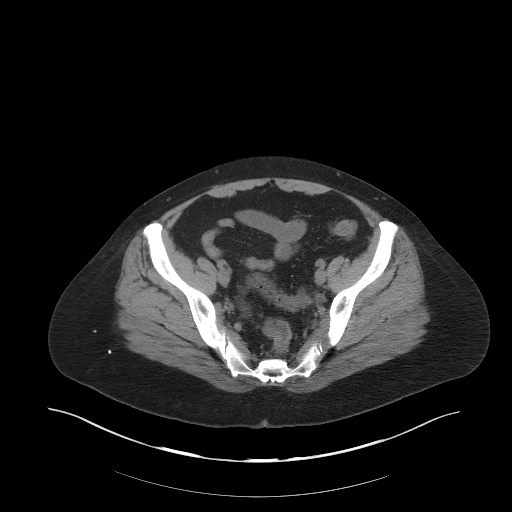
[im 48/110  soft-tissue]
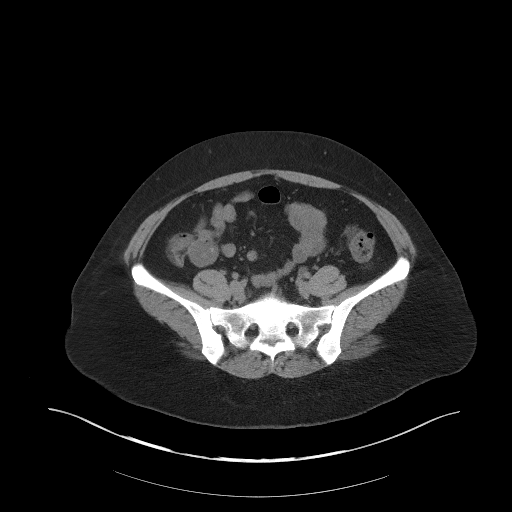
[im 57/110  soft-tissue]
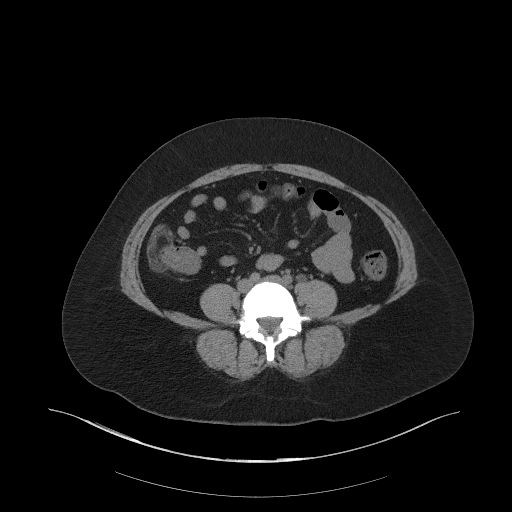
[im 62/110  soft-tissue]
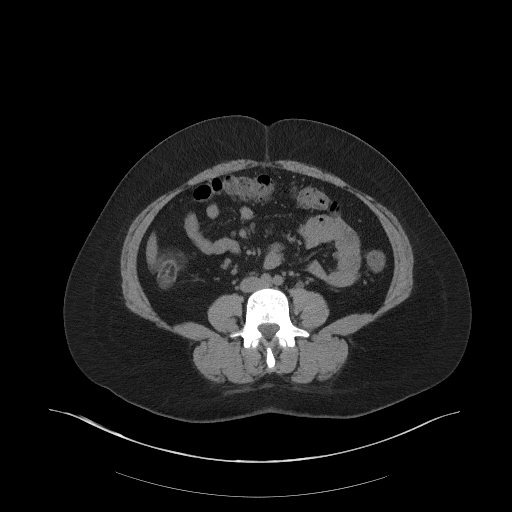
[im 70/110  soft-tissue]
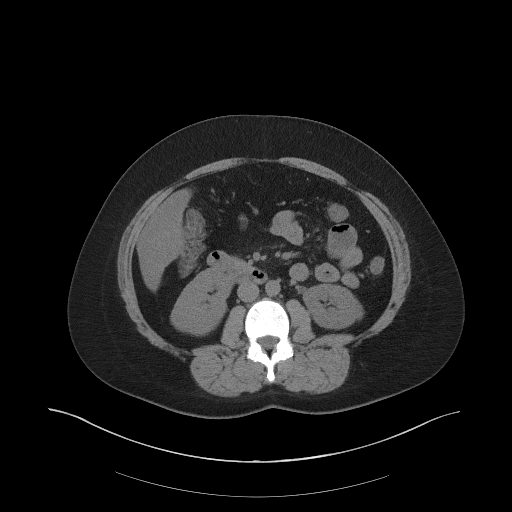
[im 70/110  bone]
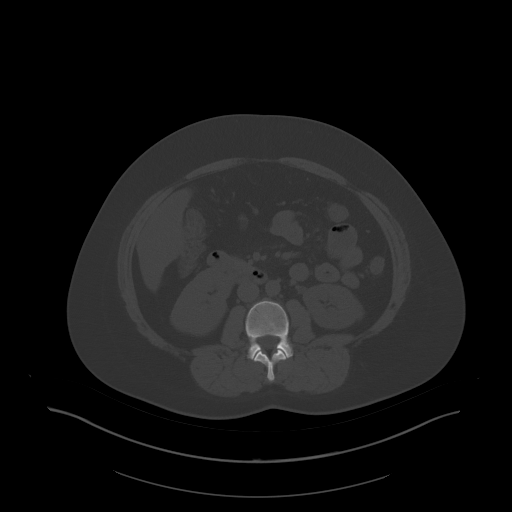
[im 79/110  soft-tissue]
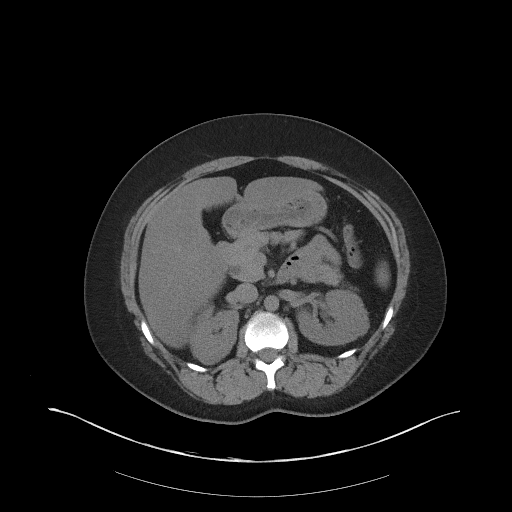
[im 88/110  soft-tissue]
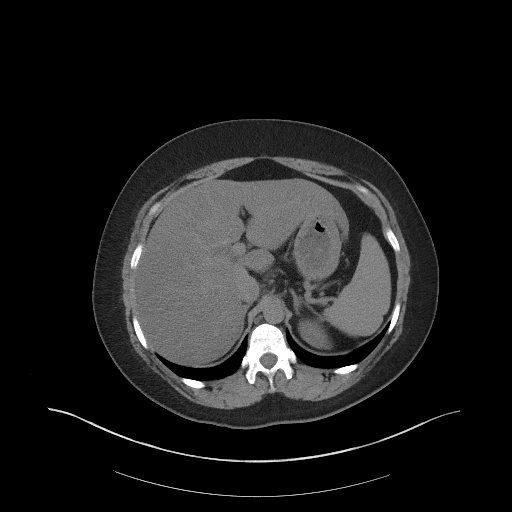
[im 96/110  soft-tissue]
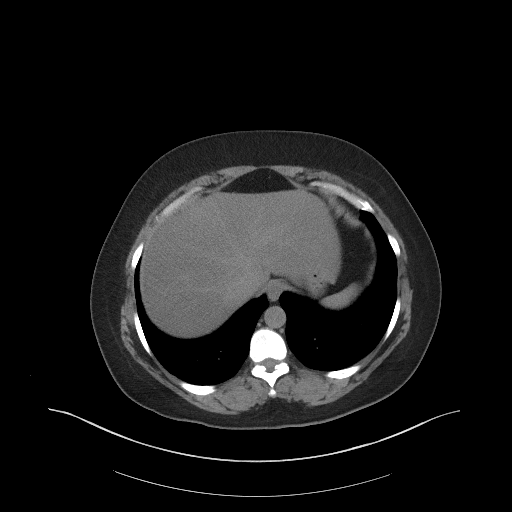
[im 105/110  soft-tissue]
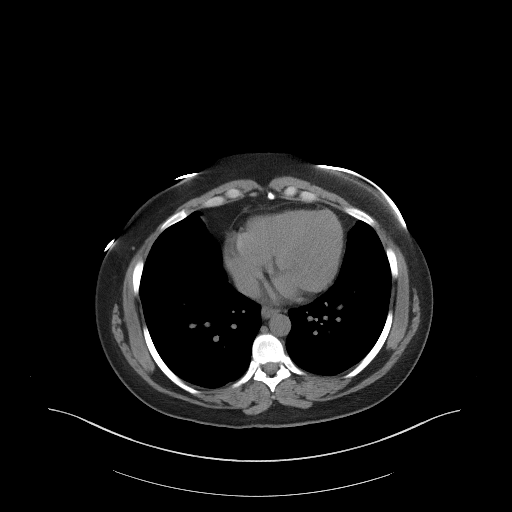

[Series 4: coronal st · coronal · 0.89mm/px · 3 of 108 slices shown]
[im 36/108  soft-tissue]
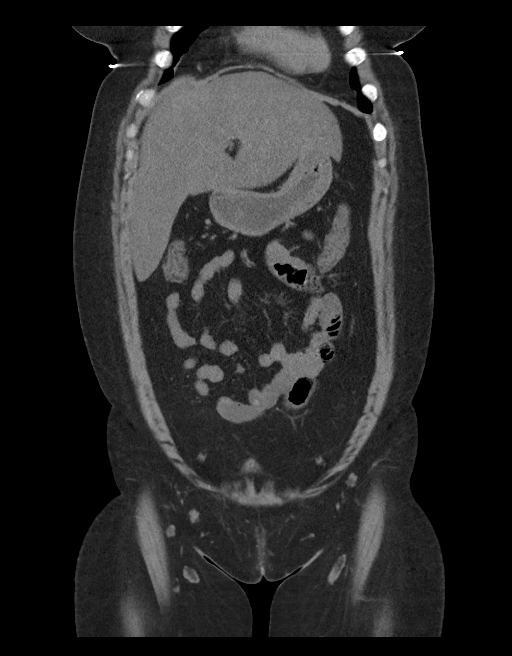
[im 48/108  soft-tissue]
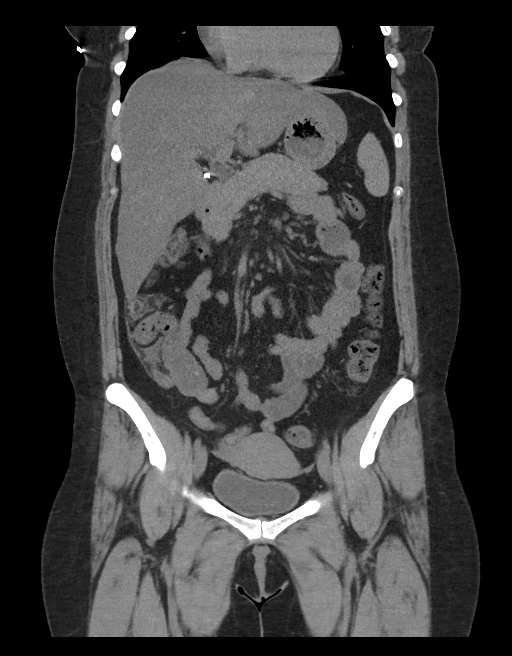
[im 60/108  soft-tissue]
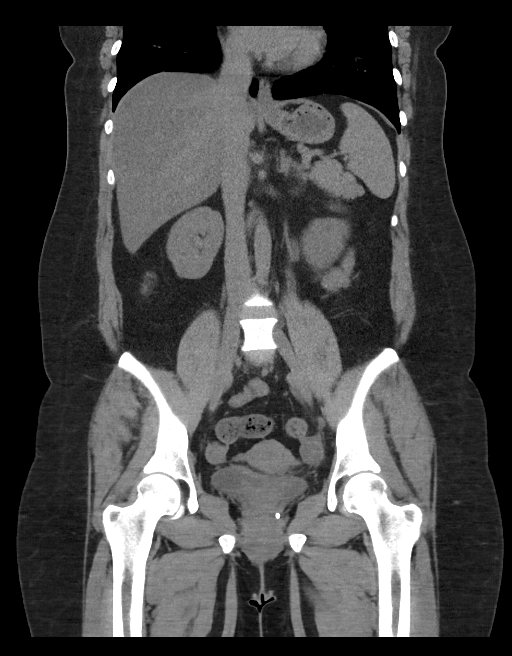

[16 of 46 positions shown; findings below may reference images not displayed]

FINDINGS: Lower chest: Lung bases are clear. No effusions. Heart is normal
size.

Hepatobiliary: Diffuse fatty infiltration of the liver without
visible focal hepatic abnormality. Prior cholecystectomy.

Pancreas: No focal abnormality or ductal dilatation.

Spleen: No focal abnormality.  Normal size.

Adrenals/Urinary Tract: There is mild fullness of the left renal
collecting system and ureter. No visible stones. No hydronephrosis
on the right. Adrenal glands and urinary bladder are unremarkable.

Stomach/Bowel: Normal appendix. Stomach, large and small bowel
grossly unremarkable.

Vascular/Lymphatic: No evidence of aneurysm or adenopathy.

Reproductive: Uterus and adnexa unremarkable.  No mass.

Other: No free fluid or free air.

Musculoskeletal: No acute bony abnormality.
IMPRESSION: No visible renal or ureteral stones. There is mild fullness of the
left renal collecting system and ureter. This may be related to
recently passed stone.

Normal appendix.

Diffuse fatty infiltration of the liver.

## 2021-03-22 ENCOUNTER — Emergency Department (HOSPITAL_BASED_OUTPATIENT_CLINIC_OR_DEPARTMENT_OTHER)
Admission: EM | Admit: 2021-03-22 | Discharge: 2021-03-22 | Disposition: A | Discharge status: Home Health | Payer: Medicaid Other | Attending: Emergency Medicine | Admitting: Emergency Medicine

## 2021-03-22 ENCOUNTER — Encounter (HOSPITAL_BASED_OUTPATIENT_CLINIC_OR_DEPARTMENT_OTHER): Payer: Self-pay | Admitting: *Deleted

## 2021-03-22 ENCOUNTER — Other Ambulatory Visit: Payer: Self-pay

## 2021-03-22 DIAGNOSIS — E119 Type 2 diabetes mellitus without complications: Secondary | ICD-10-CM | POA: Diagnosis not present

## 2021-03-22 DIAGNOSIS — R200 Anesthesia of skin: Secondary | ICD-10-CM

## 2021-03-22 DIAGNOSIS — R202 Paresthesia of skin: Secondary | ICD-10-CM | POA: Insufficient documentation

## 2021-03-22 DIAGNOSIS — F17210 Nicotine dependence, cigarettes, uncomplicated: Secondary | ICD-10-CM | POA: Diagnosis not present

## 2021-03-22 DIAGNOSIS — Z91040 Latex allergy status: Secondary | ICD-10-CM | POA: Diagnosis not present

## 2021-03-22 LAB — CBC WITH DIFFERENTIAL/PLATELET
Abs Immature Granulocytes: 0.08 10*3/uL — ABNORMAL HIGH (ref 0.00–0.07)
Basophils Absolute: 0.1 10*3/uL (ref 0.0–0.1)
Basophils Relative: 1 %
Eosinophils Absolute: 0.3 10*3/uL (ref 0.0–0.5)
Eosinophils Relative: 2 %
HCT: 46.6 % — ABNORMAL HIGH (ref 36.0–46.0)
Hemoglobin: 15.9 g/dL — ABNORMAL HIGH (ref 12.0–15.0)
Immature Granulocytes: 0 %
Lymphocytes Relative: 40 %
Lymphs Abs: 7.3 10*3/uL — ABNORMAL HIGH (ref 0.7–4.0)
MCH: 28.6 pg (ref 26.0–34.0)
MCHC: 34.1 g/dL (ref 30.0–36.0)
MCV: 84 fL (ref 80.0–100.0)
Monocytes Absolute: 0.8 10*3/uL (ref 0.1–1.0)
Monocytes Relative: 4 %
Neutro Abs: 9.7 10*3/uL — ABNORMAL HIGH (ref 1.7–7.7)
Neutrophils Relative %: 53 %
Platelets: 328 10*3/uL (ref 150–400)
RBC: 5.55 MIL/uL — ABNORMAL HIGH (ref 3.87–5.11)
RDW: 12.9 % (ref 11.5–15.5)
Smear Review: NORMAL
WBC: 18.3 10*3/uL — ABNORMAL HIGH (ref 4.0–10.5)
nRBC: 0 % (ref 0.0–0.2)

## 2021-03-22 LAB — BASIC METABOLIC PANEL
Anion gap: 10 (ref 5–15)
BUN: 10 mg/dL (ref 6–20)
CO2: 22 mmol/L (ref 22–32)
Calcium: 9.1 mg/dL (ref 8.9–10.3)
Chloride: 105 mmol/L (ref 98–111)
Creatinine, Ser: 0.69 mg/dL (ref 0.44–1.00)
GFR, Estimated: >60 mL/min (ref >60)
Glucose, Bld: 95 mg/dL (ref 70–99)
Potassium: 3.5 mmol/L (ref 3.5–5.1)
Sodium: 137 mmol/L (ref 135–145)

## 2021-03-22 NOTE — Discharge Instructions (Addendum)
It was a pleasure taking care of you today. As discussed, if your weakness gets worse, you must return to the ER for an MRI of your neck. Please follow-up with PCP on Monday for further evaluation. Return to the ER for new or worsening symptoms. Please return to the ER if you develop a fever.

## 2021-03-22 NOTE — ED Triage Notes (Signed)
Cervical steroid injection yesterday. The injection site is warm today. Here with c.o numbness in her left shoulder to her hand and tingling in her fingers.

## 2021-03-22 NOTE — ED Notes (Signed)
Pt given food, okayed per provider.

## 2021-03-22 NOTE — ED Provider Notes (Signed)
[facility] EMERGENCY DEPARTMENT Provider Note   CSN: [id number] Arrival date & time: 03/22/21  1754     History Chief Complaint  Patient presents with   Arm Pain    Dawn ScalesMelissa Sagona is a 40 y.o. female with a past medical history significant for anxiety, chronic low back pain, diabetes, and history of migraines who presents to the ED due to left upper extremity numbness/tingling and weakness.  Patient had a cervical spine injection yesterday by Dr. Allena KatzPatel with pain management. She notes she woke up this morning with acute onset of left upper extremity weakness and numbness/tingling.  Patient admits to intermittent numbness/tingling at baseline however, notes this is worse than her normal prestigious.  Patient also admits to erythema and warmth over injection site.  Denies fever and chills.  No injury to neck.  No treatment prior to arrival.  No aggravating or alleviating symptoms.  History obtained from patient and past medical records. No interpreter used during encounter.       Past Medical History:  Diagnosis Date   Anxiety    not currently on meds   Back pain    Diabetes mellitus without complication (HCC)    Gestational diabetes    Headache(784.0)    migraines since age 89   Kidney stones    Migraines    UTI (lower urinary tract infection)     Patient Active Problem List   Diagnosis Date Noted   Initial obstetric visit in first trimester 06/29/2013   UTI (lower urinary tract infection) 05/15/2013   Trichomonas 05/15/2013    Past Surgical History:  Procedure Laterality Date   CESAREAN SECTION     CHOLECYSTECTOMY     DILATION AND CURETTAGE OF UTERUS     TONSILLECTOMY     WISDOM TOOTH EXTRACTION       OB History     Gravida  2   Para  0   Term  0   Preterm  0   AB  1   Living  0      SAB  1   IAB  0   Ectopic  0   Multiple  0   Live Births              Family History  Adopted: Yes    Social History   Tobacco Use   Smoking  status: Every Day    Packs/day: 0.25    Types: Cigarettes   Smokeless tobacco: Former   Tobacco comments:    cutting down  Vaping Use   Vaping Use: Never used  Substance Use Topics   Alcohol use: No   Drug use: No    Home Medications Prior to Admission medications   Medication Sig Start Date End Date Taking? Authorizing Provider  chlorhexidine (HIBICLENS) 4 % external liquid APPLY TO AFFECTED AREA(S) TWICE WEEKLY IN THE SHOWER 06/12/20  Yes [provider]  clindamycin (CLEOCIN T) 1 % external solution Apply to affected areas daily 06/12/20  Yes [provider]  diclofenac Sodium (VOLTAREN) 1 % GEL Voltaren 1 % topical gel 04/01/14  Yes [provider]  doxycycline (VIBRA-TABS) 100 MG tablet Take 1 tablet by mouth daily. 09/04/20  Yes [provider]  ergocalciferol (VITAMIN D2) 1.25 MG (50000 UT) capsule Take 1 capsule by mouth once a week. 09/28/20  Yes [provider]  fluticasone (FLONASE) 50 MCG/ACT nasal spray Place into the nose. 01/13/20  Yes [provider]  fluticasone-salmeterol (ADVAIR HFA) 469-62115-21 MCG/ACT inhaler  Inhale into the lungs. 10/24/20  Yes [provider]  pantoprazole (PROTONIX) 40 MG tablet Take 1 tablet by mouth daily. 09/20/20  Yes [provider]  SUMAtriptan (IMITREX) 100 MG tablet Take by mouth. 08/09/18  Yes [provider]  acetaminophen-codeine (TYLENOL #3) 300-30 MG tablet Take by mouth every 4 (four) hours as needed for moderate pain.    [provider]  cyclobenzaprine (FLEXERIL) 10 MG tablet Take 10 mg by mouth 3 (three) times daily as needed for muscle spasms.    [provider]  dextromethorphan-guaiFENesin (TUSSIN DM) 10-100 MG/5ML liquid Take 5 mLs by mouth every 4 (four) hours as needed for cough. 06/26/13   Poe, Deirdre C, CNM  HYDROcodone-acetaminophen (NORCO/VICODIN) 5-325 MG tablet Take 1-2 tablets by mouth every 4 (four) hours as needed for severe pain.  03/09/17   Trixie Dredge, PA-C  ondansetron (ZOFRAN ODT) 4 MG disintegrating tablet Take 1 tablet (4 mg total) by mouth every 8 (eight) hours as needed for nausea or vomiting. 05/05/17   Horton, Mayer Masker, MD  ondansetron (ZOFRAN) 4 MG tablet Take 1 tablet (4 mg total) by mouth every 8 (eight) hours as needed for nausea or vomiting. 03/09/17   Trixie Dredge, PA-C  oxyCODONE-acetaminophen (PERCOCET/ROXICET) 5-325 MG tablet Take 1-2 tablets by mouth every 6 (six) hours as needed for severe pain. 05/05/17   Horton, Mayer Masker, MD  Prenat-FeFum-FePo-FA-Omega 3 (CONCEPT DHA) 53.5-38-1 MG CAPS Take 1 tablet by mouth daily. 06/08/13   Karim-Rhoades, Kae Heller, CNM  prochlorperazine (COMPAZINE) 10 MG tablet Take 10 mg by mouth every 6 (six) hours as needed for nausea or vomiting.    [provider]  promethazine (PHENERGAN) 25 MG tablet Take 1 tablet (25 mg total) by mouth every 6 (six) hours as needed for nausea. 06/28/13   Willodean Rosenthal, MD  ranitidine (ZANTAC) 150 MG/10ML syrup Take by mouth 2 (two) times daily.    [provider]  SUMAtriptan (IMITREX) 50 MG tablet Take 50 mg by mouth every 2 (two) hours as needed for migraine. May repeat in 2 hours if headache persists or recurs.    [provider]  tamsulosin (FLOMAX) 0.4 MG CAPS capsule Take 0.4 mg by mouth.    [provider]  tizanidine (ZANAFLEX) 2 MG capsule Take 2 mg by mouth 3 (three) times daily.    [provider]    Allergies    Latex  Review of Systems   Review of Systems  Constitutional:  Negative for chills and fever.  Musculoskeletal:  Positive for neck pain.  Neurological:  Positive for weakness (LUE) and numbness.  All other systems reviewed and are negative.  Physical Exam Updated Vital Signs BP 105/64   Pulse 77   Temp 98.5 F (36.9 C) (Oral)   Resp (!) 22   Ht 5\' 4"  (1.626 m)   Wt 96.8 kg   LMP 03/07/2021   SpO2 98%   BMI 36.61 kg/m   Physical Exam Vitals and  nursing note reviewed.  Constitutional:      General: She is not in acute distress.    Appearance: She is not ill-appearing.  HENT:     Head: Normocephalic.  Eyes:     Pupils: Pupils are equal, round, and reactive to light.  Cardiovascular:     Rate and Rhythm: Normal rate and regular rhythm.     Pulses: Normal pulses.     Heart sounds: Normal heart sounds. No murmur heard.   No friction rub. No  gallop.  Pulmonary:     Effort: Pulmonary effort is normal.     Breath sounds: Normal breath sounds.  Abdominal:     General: Abdomen is flat. There is no distension.     Palpations: Abdomen is soft.     Tenderness: There is no abdominal tenderness. There is no guarding or rebound.  Musculoskeletal:     Cervical back: Neck supple.     Comments: Decreased grip strength on left. Sensation intake bilaterally.  Full range of motion of bilateral upper extremities.  Radial pulse intact.  Skin:    General: Skin is warm and dry.  Neurological:     General: No focal deficit present.     Mental Status: She is alert.  Psychiatric:        Mood and Affect: Mood normal.        Behavior: Behavior normal.    ED Results / Procedures / Treatments   Labs (all labs ordered are listed, but only abnormal results are displayed) Labs Reviewed  CBC WITH DIFFERENTIAL/PLATELET - Abnormal; Notable for the following components:      Result Value   WBC 18.3 (*)    RBC 5.55 (*)    Hemoglobin 15.9 (*)    HCT 46.6 (*)    Neutro Abs 9.7 (*)    Lymphs Abs 7.3 (*)    Abs Immature Granulocytes 0.08 (*)    All other components within normal limits  BASIC METABOLIC PANEL    EKG None  Radiology No results found.  Procedures Procedures   Medications Ordered in ED Medications - No data to display  ED Course  I have reviewed the triage vital signs and the nursing notes.  Pertinent labs & imaging results that were available during my care of the patient were reviewed by me and considered in my medical  decision making (see chart for details).  Clinical Course as of 03/22/21 2115  Fri Mar 22, 2021  2023 WBC(!): 18.3 [CA]    Clinical Course User Index [CA] Dawn Logan, [place]   MDM Rules/Calculators/A&P                          40 year old female presents to the ED due to left upper extremity paresthesias associated with weakness after receiving a cervical steroid injection yesterday.  Patient has a history of a herniated disc and receives steroid injections yearly. She has baseline paresthesias to left upper extremity. patient also endorses erythema and warmth to injection site.  No fever or chills.  Upon arrival, vitals all within normal limits.  Patient nontoxic-appearing.  Slight decreased left grip strength. Sensation intact. Radial pulse intact bilaterally. Erythema to upper back; however there is also erythema to chest as well. Low suspicion for cellulitis. No fluctuance or induration to injection site. Labs ordered.   CBC significant for leukocytosis at 18.3.  Leukocytosis possibly due to recent steroids.  BMP normal with normal renal function and no major electrolyte derangements. Upon reassessment, patient notes her weakness has improved. Patient still appears to have slightly decreased grip strength on the left; however improvement from initial evaluation. Shared decision making in regards to transfer to [facility] for MRI cervical spine vs. PCP follow-up Monday and patient declines MRI cervical spine and would prefer to follow-up on Monday. I had a long discussion with patient in regards to the need to return to the ER if she develops worsening LUE weakness or fever/chills. She was also advised to  return for any worsening symptoms. Strict ED precautions discussed with patient. Patient states understanding and agrees to plan. Patient discharged home in no acute distress and stable vitals  Discussed case with Dr. Donnald Garre who agrees with assessment and plan.  Final Clinical  Impression(s) / ED Diagnoses Final diagnoses:  Numbness and tingling    Rx / DC Orders ED Discharge Orders     None        Jesusita Oka 03/22/21 2115    Arby Barrette, MD 04/20/21 1511

## 2022-06-06 ENCOUNTER — Other Ambulatory Visit: Payer: Self-pay

## 2022-06-06 ENCOUNTER — Other Ambulatory Visit (HOSPITAL_BASED_OUTPATIENT_CLINIC_OR_DEPARTMENT_OTHER): Payer: Self-pay

## 2022-06-06 ENCOUNTER — Encounter (HOSPITAL_BASED_OUTPATIENT_CLINIC_OR_DEPARTMENT_OTHER): Payer: Self-pay | Admitting: *Deleted

## 2022-06-06 ENCOUNTER — Emergency Department (HOSPITAL_BASED_OUTPATIENT_CLINIC_OR_DEPARTMENT_OTHER)
Admission: EM | Admit: 2022-06-06 | Discharge: 2022-06-06 | Disposition: A | Discharge status: Home / Self Care | Payer: Medicaid Other | Attending: Emergency Medicine | Admitting: Emergency Medicine

## 2022-06-06 DIAGNOSIS — R1010 Upper abdominal pain, unspecified: Secondary | ICD-10-CM | POA: Diagnosis present

## 2022-06-06 DIAGNOSIS — Z79899 Other long term (current) drug therapy: Secondary | ICD-10-CM | POA: Diagnosis not present

## 2022-06-06 DIAGNOSIS — N200 Calculus of kidney: Secondary | ICD-10-CM | POA: Diagnosis not present

## 2022-06-06 DIAGNOSIS — Z91040 Latex allergy status: Secondary | ICD-10-CM | POA: Insufficient documentation

## 2022-06-06 LAB — COMPREHENSIVE METABOLIC PANEL
ALT: 23 U/L (ref 0–44)
AST: 21 U/L (ref 15–41)
Albumin: 3.5 g/dL (ref 3.5–5.0)
Alkaline Phosphatase: 52 U/L (ref 38–126)
Anion gap: 6 (ref 5–15)
BUN: 15 mg/dL (ref 6–20)
CO2: 24 mmol/L (ref 22–32)
Calcium: 8.7 mg/dL — ABNORMAL LOW (ref 8.9–10.3)
Chloride: 108 mmol/L (ref 98–111)
Creatinine, Ser: 1.15 mg/dL — ABNORMAL HIGH (ref 0.44–1.00)
GFR, Estimated: >60 mL/min (ref >60)
Glucose, Bld: 124 mg/dL — ABNORMAL HIGH (ref 70–99)
Potassium: 4.5 mmol/L (ref 3.5–5.1)
Sodium: 138 mmol/L (ref 135–145)
Total Bilirubin: 0.7 mg/dL (ref 0.3–1.2)
Total Protein: 7.1 g/dL (ref 6.5–8.1)

## 2022-06-06 LAB — CBC WITH DIFFERENTIAL/PLATELET
Abs Immature Granulocytes: 0.04 10*3/uL (ref 0.00–0.07)
Basophils Absolute: 0.1 10*3/uL (ref 0.0–0.1)
Basophils Relative: 1 %
Eosinophils Absolute: 0.3 10*3/uL (ref 0.0–0.5)
Eosinophils Relative: 2 %
HCT: 43.5 % (ref 36.0–46.0)
Hemoglobin: 14.5 g/dL (ref 12.0–15.0)
Immature Granulocytes: 0 %
Lymphocytes Relative: 22 %
Lymphs Abs: 2.7 10*3/uL (ref 0.7–4.0)
MCH: 28.4 pg (ref 26.0–34.0)
MCHC: 33.3 g/dL (ref 30.0–36.0)
MCV: 85.3 fL (ref 80.0–100.0)
Monocytes Absolute: 0.9 10*3/uL (ref 0.1–1.0)
Monocytes Relative: 7 %
Neutro Abs: 8.4 10*3/uL — ABNORMAL HIGH (ref 1.7–7.7)
Neutrophils Relative %: 68 %
Platelets: 288 10*3/uL (ref 150–400)
RBC: 5.1 MIL/uL (ref 3.87–5.11)
RDW: 12.5 % (ref 11.5–15.5)
WBC: 12.4 10*3/uL — ABNORMAL HIGH (ref 4.0–10.5)
nRBC: 0 % (ref 0.0–0.2)

## 2022-06-06 LAB — URINALYSIS, ROUTINE W REFLEX MICROSCOPIC
Bilirubin Urine: NEGATIVE
Glucose, UA: NEGATIVE mg/dL
Ketones, ur: NEGATIVE mg/dL
Leukocytes,Ua: NEGATIVE
Nitrite: NEGATIVE
Protein, ur: NEGATIVE mg/dL
Specific Gravity, Urine: 1.01 (ref 1.005–1.030)
pH: 6 (ref 5.0–8.0)

## 2022-06-06 LAB — URINALYSIS, MICROSCOPIC (REFLEX)

## 2022-06-06 LAB — PREGNANCY, URINE: Preg Test, Ur: NEGATIVE

## 2022-06-06 LAB — LIPASE, BLOOD: Lipase: 76 U/L — ABNORMAL HIGH (ref 11–51)

## 2022-06-06 MED ORDER — FENTANYL CITRATE PF 50 MCG/ML IJ SOSY: 50.0000 ug | PREFILLED_SYRINGE | Freq: Once | INTRAMUSCULAR | Status: DC

## 2022-06-06 MED ORDER — ONDANSETRON HCL 4 MG/2ML IJ SOLN
4.0000 mg | Freq: Once | INTRAMUSCULAR | Status: AC
  Administered 2022-06-06: 4 mg via INTRAVENOUS
  Filled 2022-06-06: qty 2

## 2022-06-06 MED ORDER — OXYCODONE HCL 5 MG PO TABS
5.0000 mg | ORAL_TABLET | ORAL | 0 refills | Status: AC | PRN
  Filled 2022-06-06: qty 15, 3d supply, fill #0

## 2022-06-06 MED ORDER — TAMSULOSIN HCL 0.4 MG PO CAPS
0.4000 mg | ORAL_CAPSULE | Freq: Every day | ORAL | 0 refills | Status: AC
  Filled 2022-06-06: qty 7, 7d supply, fill #0

## 2022-06-06 MED ORDER — SODIUM CHLORIDE 0.9 % IV BOLUS
1000.0000 mL | Freq: Once | INTRAVENOUS | Status: AC
  Administered 2022-06-06: 1000 mL via INTRAVENOUS

## 2022-06-06 MED ORDER — KETOROLAC TROMETHAMINE 15 MG/ML IJ SOLN
15.0000 mg | Freq: Once | INTRAMUSCULAR | Status: AC
  Administered 2022-06-06: 15 mg via INTRAVENOUS
  Filled 2022-06-06: qty 1

## 2022-06-06 MED ORDER — HYDROMORPHONE HCL 1 MG/ML IJ SOLN
1.0000 mg | Freq: Once | INTRAMUSCULAR | Status: AC
  Administered 2022-06-06: 1 mg via INTRAVENOUS
  Filled 2022-06-06: qty 1

## 2022-06-06 NOTE — Discharge Instructions (Signed)
I have provided you with number for urology follow-up.  Recommend taking 1000 mg of Tylenol every 6 hours as needed for pain.  I have written you for Roxicodone which is a narcotic pain medicine for breakthrough pain.  Take as prescribed.  Do not mix with alcohol or drugs or dangerous activities as this medication is sedating.  Continue Zofran as needed for nausea.  Continue Flomax.  Please return if you have worsening symptoms, fever greater than 100.4.

## 2022-06-06 NOTE — ED Triage Notes (Signed)
Tuesday am awoke with left sided pain, went to [facility], was told had a kidney stone noted from CT scan, was given an ABX, Flomax, Toradol and was DC to home. Trying to take in a lot of fluid. Awoke this am with left flank pain.

## 2022-06-06 NOTE — ED Provider Notes (Signed)
[facility] EMERGENCY DEPARTMENT Provider Note   CSN: [id number] Arrival date & time: 06/06/22  0831     History  Chief Complaint  Patient presents with   Flank Pain    Dawn Logan is a 41 y.o. female.  Patient is here with ongoing left flank pain.  Was diagnosed with a kidney stone several days ago.  Has been taking Toradol without much relief.  She has no narcotic pain medicine.  Pain comes and goes.  History of kidney stones.  She is started on antibiotics as well but she denies any fever.  She denies any nausea, vomiting, chest pain, shortness of breath.  Toradol has helped but pain keeps coming back.  The history is provided by the patient.       Home Medications Prior to Admission medications   Medication Sig Start Date End Date Taking? Authorizing Provider  oxyCODONE (ROXICODONE) 5 MG immediate release tablet Take 1 tablet (5 mg total) by mouth every 4 (four) hours as needed for up to 15 days for breakthrough pain. 06/06/22 06/21/22 Yes Vanice Rappa, DO  tamsulosin (FLOMAX) 0.4 MG CAPS capsule Take 1 capsule (0.4 mg total) by mouth daily for 7 days. 06/06/22 06/13/22 Yes Larrissa Stivers, DO  acetaminophen-codeine (TYLENOL #3) 300-30 MG tablet Take by mouth every 4 (four) hours as needed for moderate pain.    [provider]  chlorhexidine (HIBICLENS) 4 % external liquid APPLY TO AFFECTED AREA(S) TWICE WEEKLY IN THE SHOWER 06/12/20   [provider]  clindamycin (CLEOCIN T) 1 % external solution Apply to affected areas daily 06/12/20   [provider]  cyclobenzaprine (FLEXERIL) 10 MG tablet Take 10 mg by mouth 3 (three) times daily as needed for muscle spasms.    [provider]  dextromethorphan-guaiFENesin (TUSSIN DM) 10-100 MG/5ML liquid Take 5 mLs by mouth every 4 (four) hours as needed for cough. 06/26/13   Poe, Deirdre C, CNM  diclofenac Sodium (VOLTAREN) 1 % GEL Voltaren 1 % topical gel 04/01/14   [provider]   doxycycline (VIBRA-TABS) 100 MG tablet Take 1 tablet by mouth daily. 09/04/20   [provider]  ergocalciferol (VITAMIN D2) 1.25 MG (50000 UT) capsule Take 1 capsule by mouth once a week. 09/28/20   [provider]  fluticasone (FLONASE) 50 MCG/ACT nasal spray Place into the nose. 01/13/20   [provider]  fluticasone-salmeterol (ADVAIR HFA) 194-17 MCG/ACT inhaler Inhale into the lungs. 10/24/20   [provider]  HYDROcodone-acetaminophen (NORCO/VICODIN) 5-325 MG tablet Take 1-2 tablets by mouth every 4 (four) hours as needed for severe pain. 03/09/17   Trixie Dredge, PA-C  ondansetron (ZOFRAN ODT) 4 MG disintegrating tablet Take 1 tablet (4 mg total) by mouth every 8 (eight) hours as needed for nausea or vomiting. 05/05/17   Horton, Mayer Masker, MD  ondansetron (ZOFRAN) 4 MG tablet Take 1 tablet (4 mg total) by mouth every 8 (eight) hours as needed for nausea or vomiting. 03/09/17   Trixie Dredge, PA-C  oxyCODONE-acetaminophen (PERCOCET/ROXICET) 5-325 MG tablet Take 1-2 tablets by mouth every 6 (six) hours as needed for severe pain. 05/05/17   Horton, Mayer Masker, MD  pantoprazole (PROTONIX) 40 MG tablet Take 1 tablet by mouth daily. 09/20/20   [provider]  Prenat-FeFum-FePo-FA-Omega 3 (CONCEPT DHA) 53.5-38-1 MG CAPS Take 1 tablet by mouth daily. 06/08/13   Karim-Rhoades, Kae Heller, CNM  prochlorperazine (COMPAZINE) 10 MG tablet Take 10 mg by mouth every 6 (six) hours as needed for nausea  or vomiting.    [provider]  promethazine (PHENERGAN) 25 MG tablet Take 1 tablet (25 mg total) by mouth every 6 (six) hours as needed for nausea. 06/28/13   Lavonia Drafts, MD  ranitidine (ZANTAC) 150 MG/10ML syrup Take by mouth 2 (two) times daily.    [provider]  SUMAtriptan (IMITREX) 100 MG tablet Take by mouth. 08/09/18   [provider]  SUMAtriptan (IMITREX) 50 MG tablet Take 50 mg by mouth every 2 (two) hours as needed for  migraine. May repeat in 2 hours if headache persists or recurs.    [provider]  tizanidine (ZANAFLEX) 2 MG capsule Take 2 mg by mouth 3 (three) times daily.    [provider]      Allergies    Latex    Review of Systems   Review of Systems  Physical Exam Updated Vital Signs BP (!) 140/75   Pulse 76   Temp 97.9 F (36.6 C) (Oral)   Resp 16   SpO2 98%  Physical Exam Vitals and nursing note reviewed.  Constitutional:      General: She is not in acute distress.    Appearance: She is well-developed. She is not ill-appearing.  HENT:     Head: Normocephalic and atraumatic.  Eyes:     Extraocular Movements: Extraocular movements intact.     Conjunctiva/sclera: Conjunctivae normal.     Pupils: Pupils are equal, round, and reactive to light.  Cardiovascular:     Rate and Rhythm: Normal rate and regular rhythm.     Pulses: Normal pulses.     Heart sounds: Normal heart sounds. No murmur heard. Pulmonary:     Effort: Pulmonary effort is normal. No respiratory distress.     Breath sounds: Normal breath sounds.  Abdominal:     Palpations: Abdomen is soft.     Tenderness: There is no abdominal tenderness. There is left CVA tenderness.  Musculoskeletal:        General: No swelling.     Cervical back: Normal range of motion and neck supple.  Skin:    General: Skin is warm and dry.     Capillary Refill: Capillary refill takes less than 2 seconds.  Neurological:     General: No focal deficit present.     Mental Status: She is alert.  Psychiatric:        Mood and Affect: Mood normal.     ED Results / Procedures / Treatments   Labs (all labs ordered are listed, but only abnormal results are displayed) Labs Reviewed  CBC WITH DIFFERENTIAL/PLATELET - Abnormal; Notable for the following components:      Result Value   WBC 12.4 (*)    Neutro Abs 8.4 (*)    All other components within normal limits  COMPREHENSIVE METABOLIC PANEL - Abnormal; Notable for the  following components:   Glucose, Bld 124 (*)    Creatinine, Ser 1.15 (*)    Calcium 8.7 (*)    All other components within normal limits  LIPASE, BLOOD - Abnormal; Notable for the following components:   Lipase 76 (*)    All other components within normal limits  URINALYSIS, ROUTINE W REFLEX MICROSCOPIC - Abnormal; Notable for the following components:   Hgb urine dipstick MODERATE (*)    All other components within normal limits  URINALYSIS, MICROSCOPIC (REFLEX) - Abnormal; Notable for the following components:   Bacteria, UA RARE (*)    All other components within normal limits  PREGNANCY,  URINE    EKG None  Radiology No results found.  Procedures Procedures    Medications Ordered in ED Medications  sodium chloride 0.9 % bolus 1,000 mL (0 mLs Intravenous Stopped 06/06/22 0956)  ondansetron (ZOFRAN) injection 4 mg (4 mg Intravenous Given 06/06/22 0900)  HYDROmorphone (DILAUDID) injection 1 mg (1 mg Intravenous Given 06/06/22 0900)  ketorolac (TORADOL) 15 MG/ML injection 15 mg (15 mg Intravenous Given 06/06/22 0900)    ED Course/ Medical Decision Making/ A&P                           Medical Decision Making Amount and/or Complexity of Data Reviewed Labs: ordered.  Risk Prescription drug management.   Avangelina Flight is here with left flank pain.  Per my review of chart she was diagnosed with a 5 mm kidney stone in the left proximal ureter 3 days ago.  Suspect that she has ongoing pain from kidney stone.  Pain is in the same location.  She has no fever.  Vital signs are normal.  Will recheck basic labs to make sure creatinine and urinalysis are okay.  I have no/low concern for infected kidney stone at this time.  It appears that her pain has not been adequately controlled at home.  She is only been taking Toradol.  We will give her a dose IV Dilaudid, IV Toradol, IV Zofran and IV fluids and reevaluate.  No need for further imaging at this time as I suspect that kidney stone  is just migrating down the ureter at this time.  Per my review and interpretation of labs is no significant anemia, electrolyte abnormality, kidney injury.  Urinalysis negative for infection.  She is feeling much better after pain medication.  Will prescribe her narcotic pain medicine for breakthrough pain.  She understands return precautions.  At this time no concern for infected stone.  Discharged in good condition.  This chart was dictated using voice recognition software.  Despite best efforts to proofread,  errors can occur which can change the documentation meaning.         Final Clinical Impression(s) / ED Diagnoses Final diagnoses:  Kidney stone    Rx / DC Orders ED Discharge Orders          Ordered    oxyCODONE (ROXICODONE) 5 MG immediate release tablet  Every 4 hours PRN        06/06/22 1034    tamsulosin (FLOMAX) 0.4 MG CAPS capsule  Daily        06/06/22 1034              Lamarion Mcevers, DO 06/06/22 1036

## 2022-06-25 ENCOUNTER — Other Ambulatory Visit (HOSPITAL_BASED_OUTPATIENT_CLINIC_OR_DEPARTMENT_OTHER): Payer: Self-pay

## 2022-06-25 ENCOUNTER — Emergency Department (HOSPITAL_BASED_OUTPATIENT_CLINIC_OR_DEPARTMENT_OTHER): Payer: Medicaid Other

## 2022-06-25 ENCOUNTER — Encounter (HOSPITAL_BASED_OUTPATIENT_CLINIC_OR_DEPARTMENT_OTHER): Payer: Self-pay | Admitting: Pediatrics

## 2022-06-25 ENCOUNTER — Other Ambulatory Visit: Payer: Self-pay

## 2022-06-25 ENCOUNTER — Emergency Department (HOSPITAL_BASED_OUTPATIENT_CLINIC_OR_DEPARTMENT_OTHER)
Admission: EM | Admit: 2022-06-25 | Discharge: 2022-06-25 | Disposition: A | Discharge status: Home / Self Care | Payer: Medicaid Other | Attending: Emergency Medicine | Admitting: Emergency Medicine

## 2022-06-25 DIAGNOSIS — N132 Hydronephrosis with renal and ureteral calculous obstruction: Secondary | ICD-10-CM | POA: Diagnosis not present

## 2022-06-25 DIAGNOSIS — R109 Unspecified abdominal pain: Secondary | ICD-10-CM | POA: Diagnosis present

## 2022-06-25 DIAGNOSIS — D72829 Elevated white blood cell count, unspecified: Secondary | ICD-10-CM | POA: Insufficient documentation

## 2022-06-25 DIAGNOSIS — Z91040 Latex allergy status: Secondary | ICD-10-CM | POA: Diagnosis not present

## 2022-06-25 DIAGNOSIS — E119 Type 2 diabetes mellitus without complications: Secondary | ICD-10-CM | POA: Diagnosis not present

## 2022-06-25 DIAGNOSIS — N200 Calculus of kidney: Secondary | ICD-10-CM

## 2022-06-25 LAB — COMPREHENSIVE METABOLIC PANEL
ALT: 25 U/L (ref 0–44)
AST: 19 U/L (ref 15–41)
Albumin: 4.1 g/dL (ref 3.5–5.0)
Alkaline Phosphatase: 57 U/L (ref 38–126)
Anion gap: 9 (ref 5–15)
BUN: 12 mg/dL (ref 6–20)
CO2: 24 mmol/L (ref 22–32)
Calcium: 9.1 mg/dL (ref 8.9–10.3)
Chloride: 104 mmol/L (ref 98–111)
Creatinine, Ser: 0.75 mg/dL (ref 0.44–1.00)
GFR, Estimated: >60 mL/min (ref >60)
Glucose, Bld: 126 mg/dL — ABNORMAL HIGH (ref 70–99)
Potassium: 3.6 mmol/L (ref 3.5–5.1)
Sodium: 137 mmol/L (ref 135–145)
Total Bilirubin: 0.6 mg/dL (ref 0.3–1.2)
Total Protein: 7.4 g/dL (ref 6.5–8.1)

## 2022-06-25 LAB — LIPASE, BLOOD: Lipase: 38 U/L (ref 11–51)

## 2022-06-25 LAB — CBC WITH DIFFERENTIAL/PLATELET
Abs Immature Granulocytes: 0.04 10*3/uL (ref 0.00–0.07)
Basophils Absolute: 0.1 10*3/uL (ref 0.0–0.1)
Basophils Relative: 1 %
Eosinophils Absolute: 0.3 10*3/uL (ref 0.0–0.5)
Eosinophils Relative: 3 %
HCT: 46.4 % — ABNORMAL HIGH (ref 36.0–46.0)
Hemoglobin: 15.4 g/dL — ABNORMAL HIGH (ref 12.0–15.0)
Immature Granulocytes: 0 %
Lymphocytes Relative: 27 %
Lymphs Abs: 3.5 10*3/uL (ref 0.7–4.0)
MCH: 28.3 pg (ref 26.0–34.0)
MCHC: 33.2 g/dL (ref 30.0–36.0)
MCV: 85.3 fL (ref 80.0–100.0)
Monocytes Absolute: 0.7 10*3/uL (ref 0.1–1.0)
Monocytes Relative: 5 %
Neutro Abs: 8.1 10*3/uL — ABNORMAL HIGH (ref 1.7–7.7)
Neutrophils Relative %: 64 %
Platelets: 345 10*3/uL (ref 150–400)
RBC: 5.44 MIL/uL — ABNORMAL HIGH (ref 3.87–5.11)
RDW: 12.4 % (ref 11.5–15.5)
WBC: 12.7 10*3/uL — ABNORMAL HIGH (ref 4.0–10.5)
nRBC: 0 % (ref 0.0–0.2)

## 2022-06-25 LAB — URINALYSIS, MICROSCOPIC (REFLEX): RBC / HPF: >50 RBC/hpf (ref 0–5)

## 2022-06-25 LAB — URINALYSIS, ROUTINE W REFLEX MICROSCOPIC
Glucose, UA: NEGATIVE mg/dL
Ketones, ur: NEGATIVE mg/dL
Leukocytes,Ua: NEGATIVE
Nitrite: NEGATIVE
Protein, ur: 100 mg/dL — AB
Specific Gravity, Urine: >=1.03 (ref 1.005–1.030)
pH: 5.5 (ref 5.0–8.0)

## 2022-06-25 LAB — PREGNANCY, URINE: Preg Test, Ur: NEGATIVE

## 2022-06-25 MED ORDER — SODIUM CHLORIDE 0.9 % IV BOLUS: 500.0000 mL | Freq: Once | INTRAVENOUS | Status: DC

## 2022-06-25 MED ORDER — OXYCODONE HCL 5 MG PO TABS
5.0000 mg | ORAL_TABLET | Freq: Four times a day (QID) | ORAL | 0 refills | Status: AC | PRN
  Filled 2022-06-25: qty 15, 4d supply, fill #0

## 2022-06-25 MED ORDER — HYDROMORPHONE HCL 1 MG/ML IJ SOLN
1.0000 mg | Freq: Once | INTRAMUSCULAR | Status: AC
  Administered 2022-06-25: 1 mg via INTRAVENOUS
  Filled 2022-06-25: qty 1

## 2022-06-25 MED ORDER — TAMSULOSIN HCL 0.4 MG PO CAPS
0.4000 mg | ORAL_CAPSULE | Freq: Every day | ORAL | 0 refills | Status: AC
  Filled 2022-06-25: qty 30, 30d supply, fill #0

## 2022-06-25 MED ORDER — ONDANSETRON 4 MG PO TBDP
4.0000 mg | ORAL_TABLET | Freq: Three times a day (TID) | ORAL | 0 refills | Status: AC | PRN
  Filled 2022-06-25: qty 20, 7d supply, fill #0

## 2022-06-25 MED ORDER — ONDANSETRON HCL 4 MG/2ML IJ SOLN
4.0000 mg | Freq: Once | INTRAMUSCULAR | Status: AC
  Administered 2022-06-25: 4 mg via INTRAVENOUS
  Filled 2022-06-25: qty 2

## 2022-06-25 MED ORDER — MORPHINE SULFATE (PF) 4 MG/ML IV SOLN
4.0000 mg | Freq: Once | INTRAVENOUS | Status: AC
  Administered 2022-06-25: 4 mg via INTRAVENOUS
  Filled 2022-06-25: qty 1

## 2022-06-25 MED ORDER — SODIUM CHLORIDE 0.9 % IV BOLUS
1000.0000 mL | Freq: Once | INTRAVENOUS | Status: AC
  Administered 2022-06-25: 1000 mL via INTRAVENOUS

## 2022-06-25 NOTE — Discharge Instructions (Addendum)
You have been seen today for your complaint of left-sided flank pain. Your lab work showed signs of a kidney stone. Your imaging showed swelling of the left kidney which is consistent with a blockage caused by kidney stone. Your discharge medications include oxycodone.  This is an opioid pain medication.  You should not drive or operate heavy machinery while taking this medication.  This medication may cause constipation.  You may take a laxative while using this medication.  You should only use it as needed. Zofran.  This is a nausea medication.  You should take it as needed. Flomax.  This is a medication used to help pass the kidney stone.  You should take as prescribed and until this time Home care instructions are as follows:  You should drink plenty of fluids until the stone does pass.  You should place a strainer in the toilet every time you urinate in order to ensure that the stone has passed. Follow up with: Dr. Alvester Morin.  He is a urologist.  Bonita Quin should call the phone number listed on this packet to schedule an appointment for an ED follow-up visit. Please seek immediate medical care if you develop any of the following symptoms: You have a fever or chills. You develop severe pain. You develop new abdominal pain. You faint. You are unable to urinate. At this time there does not appear to be the presence of an emergent medical condition, however there is always the potential for conditions to change. Please read and follow the below instructions.  Do not take your medicine if  develop an itchy rash, swelling in your mouth or lips, or difficulty breathing; call 911 and seek immediate emergency medical attention if this occurs.  You may review your lab tests and imaging results in their entirety on your MyChart account.  Please discuss all results of fully with your primary care provider and other specialist at your follow-up visit.  Note: Portions of this text may have been transcribed using  voice recognition software. Every effort was made to ensure accuracy; however, inadvertent computerized transcription errors may still be present.

## 2022-06-25 NOTE — ED Triage Notes (Signed)
C/O lower abdominal pain started earlier today along with urinary urgency and frequency with decreased volume.

## 2022-06-25 NOTE — ED Notes (Signed)
Attempted IV access x 2 with no success.  Will have another RN attempt

## 2022-06-25 NOTE — ED Provider Notes (Signed)
[facility] EMERGENCY DEPARTMENT Provider Note   CSN: [id number] Arrival date & time: 06/25/22  4235     History  Chief Complaint  Patient presents with   Abdominal Pain    Dawn Logan is a 41 y.o. female.  With history of kidney stones, diabetes, frequent UTIs who presents to the ED for evaluation of left flank pain, suprapubic pressure, urinary urgency.  Patient states symptoms feel similar to when she has had kidney stones in the past.  Patient most recently had kidney stone diagnosis on 06/06/2022.  She was treated for this with Flomax and pain control.  She states she believes she did pass stone because her symptoms suddenly resolved.  Patient states her last normal urination was this morning when she got out of bed.  She then states she was driving her kids to school when she noticed immediate left-sided sharp back pain with radiation to the left flank and urinary urgency.  She states she tried to urinate multiple times but could not get anything out.  Rates the pain at a 7 out of 10.  Reports nausea secondary to pain.  Denies fevers, chills, abdominal pain, hematuria, right-sided back or flank pain, vaginal symptoms.   Abdominal Pain      Home Medications Prior to Admission medications   Medication Sig Start Date End Date Taking? Authorizing Provider  acetaminophen-codeine (TYLENOL #3) 300-30 MG tablet Take by mouth every 4 (four) hours as needed for moderate pain.    [provider]  chlorhexidine (HIBICLENS) 4 % external liquid APPLY TO AFFECTED AREA(S) TWICE WEEKLY IN THE SHOWER 06/12/20   [provider]  clindamycin (CLEOCIN T) 1 % external solution Apply to affected areas daily 06/12/20   [provider]  cyclobenzaprine (FLEXERIL) 10 MG tablet Take 10 mg by mouth 3 (three) times daily as needed for muscle spasms.    [provider]  dextromethorphan-guaiFENesin (TUSSIN DM) 10-100 MG/5ML liquid Take 5 mLs by mouth every 4  (four) hours as needed for cough. 06/26/13   Poe, Deirdre C, CNM  diclofenac Sodium (VOLTAREN) 1 % GEL Voltaren 1 % topical gel 04/01/14   [provider]  doxycycline (VIBRA-TABS) 100 MG tablet Take 1 tablet by mouth daily. 09/04/20   [provider]  ergocalciferol (VITAMIN D2) 1.25 MG (50000 UT) capsule Take 1 capsule by mouth once a week. 09/28/20   [provider]  fluticasone (FLONASE) 50 MCG/ACT nasal spray Place into the nose. 01/13/20   [provider]  fluticasone-salmeterol (ADVAIR HFA) 361-44 MCG/ACT inhaler Inhale into the lungs. 10/24/20   [provider]  HYDROcodone-acetaminophen (NORCO/VICODIN) 5-325 MG tablet Take 1-2 tablets by mouth every 4 (four) hours as needed for severe pain. 03/09/17   Trixie Dredge, PA-C  ondansetron (ZOFRAN ODT) 4 MG disintegrating tablet Take 1 tablet (4 mg total) by mouth every 8 (eight) hours as needed for nausea or vomiting. 05/05/17   Horton, Mayer Masker, MD  ondansetron (ZOFRAN) 4 MG tablet Take 1 tablet (4 mg total) by mouth every 8 (eight) hours as needed for nausea or vomiting. 03/09/17   Trixie Dredge, PA-C  oxyCODONE-acetaminophen (PERCOCET/ROXICET) 5-325 MG tablet Take 1-2 tablets by mouth every 6 (six) hours as needed for severe pain. 05/05/17   Horton, Mayer Masker, MD  pantoprazole (PROTONIX) 40 MG tablet Take 1 tablet by mouth daily. 09/20/20   [provider]  Prenat-FeFum-FePo-FA-Omega 3 (CONCEPT DHA) 53.5-38-1 MG CAPS Take 1 tablet by mouth daily. 06/08/13   Tommi Emery  N, CNM  prochlorperazine (COMPAZINE) 10 MG tablet Take 10 mg by mouth every 6 (six) hours as needed for nausea or vomiting.    [provider]  promethazine (PHENERGAN) 25 MG tablet Take 1 tablet (25 mg total) by mouth every 6 (six) hours as needed for nausea. 06/28/13   Willodean Rosenthal, MD  ranitidine (ZANTAC) 150 MG/10ML syrup Take by mouth 2 (two) times daily.    [provider]  SUMAtriptan (IMITREX)  100 MG tablet Take by mouth. 08/09/18   [provider]  SUMAtriptan (IMITREX) 50 MG tablet Take 50 mg by mouth every 2 (two) hours as needed for migraine. May repeat in 2 hours if headache persists or recurs.    [provider]  tizanidine (ZANAFLEX) 2 MG capsule Take 2 mg by mouth 3 (three) times daily.    [provider]      Allergies    Latex    Review of Systems   Review of Systems  Gastrointestinal:  Positive for abdominal pain.  Genitourinary:  Positive for urgency.  All other systems reviewed and are negative.   Physical Exam Updated Vital Signs BP 129/63 (BP Location: Right Arm)   Pulse 75   Temp 97.6 F (36.4 C) (Oral)   Resp 18   Ht 5\' 4"  (1.626 m)   Wt 95.3 kg   LMP 06/02/2022 (Approximate)   SpO2 100%   BMI 36.05 kg/m  Physical Exam Vitals and nursing note reviewed.  Constitutional:      General: She is not in acute distress.    Appearance: Normal appearance. She is obese. She is not ill-appearing, toxic-appearing or diaphoretic.  HENT:     Head: Normocephalic and atraumatic.  Pulmonary:     Effort: Pulmonary effort is normal. No respiratory distress.  Abdominal:     General: Abdomen is flat.     Palpations: Abdomen is soft.     Tenderness: There is abdominal tenderness in the suprapubic area and left lower quadrant. There is left CVA tenderness. There is no right CVA tenderness, guarding or rebound. Negative signs include Rovsing's sign and McBurney's sign.  Musculoskeletal:        General: Normal range of motion.     Cervical back: Neck supple.  Skin:    General: Skin is warm and dry.     Capillary Refill: Capillary refill takes less than 2 seconds.  Neurological:     General: No focal deficit present.     Mental Status: She is alert and oriented to person, place, and time.  Psychiatric:        Mood and Affect: Mood normal.        Behavior: Behavior normal.     ED Results / Procedures / Treatments   Labs (all labs  ordered are listed, but only abnormal results are displayed) Labs Reviewed  CBC WITH DIFFERENTIAL/PLATELET  COMPREHENSIVE METABOLIC PANEL  URINALYSIS, ROUTINE W REFLEX MICROSCOPIC  PREGNANCY, URINE    EKG None  Radiology No results found.  Procedures Procedures    Medications Ordered in ED Medications  sodium chloride 0.9 % bolus 500 mL (has no administration in time range)  ondansetron (ZOFRAN) injection 4 mg (has no administration in time range)  morphine (PF) 4 MG/ML injection 4 mg (has no administration in time range)    ED Course/ Medical Decision Making/ A&P Clinical Course as of 06/25/22 1226  Wed Jun 25, 2022  1133 Urinalysis, Routine w reflex microscopic Urine, Clean Catch(!) Urinalysis with signs of  trauma likely secondary to stone [AS]  1134 US Renal Ultrasound shows mild to moderate left-sided hydronephrosis, consistent with kidney stone presentation [AS]  1147 Reevaluation shows patient's pain has improved, but is still a 5 out of 10.  Nausea has improved.  We will treat patient with IV Dilaudid [AS]    Clinical Course User Index [AS] Carr Shartzer, Edsel Petrin, PA-C                           Medical Decision Making Amount and/or Complexity of Data Reviewed Labs: ordered. Radiology: ordered.  Risk Prescription drug management.  This patient presents to the ED for concern of left flank pain, this involves an extensive number of treatment options, and is a complaint that carries with it a high risk of complications and morbidity.  The differential diagnosis of emergent flank pain includes, but is not limited to :Abdominal aortic aneurysm,, Renal artery embolism,Renal vein thrombosis, Aortic dissection, Mesenteric ischemia, Pyelonephritis, Renal infarction, Renal hemorrhage, Nephrolithiasis/ Renal Colic, Bladder tumor,Cystitis, Biliary colic, Pancreatitis Perforated peptic ulcer Appendicitis ,Inguinal Hernia, Diverticulitis, Bowel obstruction Ectopic  Pregnancy,PID/TOA,Ovarian cyst, Ovarian torsion Shingles Lower lobe pneumonia, Retroperitoneal hematoma/abscess/tumor, Epidural abscess, Epidural hematoma    Co morbidities that complicate the patient evaluation   previous kidney stones, frequent UTIs, anxiety  My initial workup includes  Additional history obtained from: Nursing notes from this visit. Previous records within EMR system visits on 06/03/2022 and 06/06/2022 for kidney stones on the left side  I ordered, reviewed and interpreted labs which include: CBC, BMP, urinalysis, lipase   I ordered imaging studies including renal ultrasound I independently visualized and interpreted imaging which showed mild to moderate hydronephrosis of the left kidney.  Right kidney normal.  I agree with the radiologist interpretation  Afebrile, hemodynamically stable.  Patient is a 41 year old female with a history for recurrent kidney stones and UTIs who presents ED for evaluation of sudden onset left-sided flank pai, urinary urgency, and nausea.  Upon reevaluation, patient states that she has started a new medication for her migraines requiring an infusion every 3 months.  She states she did look at the side effects of this medication and I did state that there are urologic complications and she believes this may be the cause of her recurrent kidney stones.  Patient states she has been waiting for a call from the urologist for follow-up, but has not received one from either [facility] or [facility].  Physical exam remarkable for left-sided CVA tenderness and mild suprapubic tenderness, otherwise unremarkable.  Lab work-up remarkable for significant amount of blood in the urine.  Also remarkable for slight leukocytosis of 12.7.  This is similar to previous results.  Also has a slight increase in her hemoglobin.  These results may be secondary to hemoconcentration from dehydration.  There are no lab signs indicating renal infection.  Ultrasound did show mild to  moderate hydronephrosis of the left kidney.  Patient was rehydrated and had her pain controlled in the ED.  She will be started on home pain control, Flomax, and Zofran for nausea.  Educated to strain her urine in order to assure that the stone has passed.  Patient was informed to call the urology office to schedule an appointment for an ED follow-up visit.  Given strict return precautions.  Stable at discharge.  At this time there does not appear to be any evidence of an acute emergency medical condition and the patient appears stable for discharge with appropriate outpatient  follow up. Diagnosis was discussed with patient who verbalizes understanding of care plan and is agreeable to discharge. I have discussed return precautions with patient who verbalizes understanding. Patient encouraged to follow-up with their PCP within 1 week. All questions answered.  Patient's case discussed with Dr. Lockie Mola who agrees with plan to discharge with follow-up.   Note: Portions of this report may have been transcribed using voice recognition software. Every effort was made to ensure accuracy; however, inadvertent computerized transcription errors may still be present.          Final Clinical Impression(s) / ED Diagnoses Final diagnoses:  None    Rx / DC Orders ED Discharge Orders     None         Michelle Piper, PA-C 06/25/22 1256    Virgina Norfolk, DO 06/25/22 1518

## 2022-06-30 ENCOUNTER — Encounter (HOSPITAL_BASED_OUTPATIENT_CLINIC_OR_DEPARTMENT_OTHER): Payer: Self-pay | Admitting: Emergency Medicine

## 2022-06-30 ENCOUNTER — Emergency Department (HOSPITAL_BASED_OUTPATIENT_CLINIC_OR_DEPARTMENT_OTHER): Payer: Medicaid Other

## 2022-06-30 ENCOUNTER — Emergency Department (HOSPITAL_BASED_OUTPATIENT_CLINIC_OR_DEPARTMENT_OTHER)
Admission: EM | Admit: 2022-06-30 | Discharge: 2022-06-30 | Disposition: A | Discharge status: Home / Self Care | Payer: Medicaid Other | Attending: Emergency Medicine | Admitting: Emergency Medicine

## 2022-06-30 ENCOUNTER — Other Ambulatory Visit: Payer: Self-pay

## 2022-06-30 DIAGNOSIS — Z91040 Latex allergy status: Secondary | ICD-10-CM | POA: Diagnosis not present

## 2022-06-30 DIAGNOSIS — D72829 Elevated white blood cell count, unspecified: Secondary | ICD-10-CM | POA: Diagnosis not present

## 2022-06-30 DIAGNOSIS — N132 Hydronephrosis with renal and ureteral calculous obstruction: Secondary | ICD-10-CM | POA: Insufficient documentation

## 2022-06-30 DIAGNOSIS — R1032 Left lower quadrant pain: Secondary | ICD-10-CM | POA: Diagnosis present

## 2022-06-30 LAB — URINALYSIS, ROUTINE W REFLEX MICROSCOPIC
Bilirubin Urine: NEGATIVE
Glucose, UA: NEGATIVE mg/dL
Ketones, ur: NEGATIVE mg/dL
Leukocytes,Ua: NEGATIVE
Nitrite: NEGATIVE
Protein, ur: NEGATIVE mg/dL
Specific Gravity, Urine: 1.025 (ref 1.005–1.030)
pH: 5.5 (ref 5.0–8.0)

## 2022-06-30 LAB — COMPREHENSIVE METABOLIC PANEL
ALT: 29 U/L (ref 0–44)
AST: 20 U/L (ref 15–41)
Albumin: 4.2 g/dL (ref 3.5–5.0)
Alkaline Phosphatase: 59 U/L (ref 38–126)
Anion gap: 9 (ref 5–15)
BUN: 10 mg/dL (ref 6–20)
CO2: 25 mmol/L (ref 22–32)
Calcium: 9.1 mg/dL (ref 8.9–10.3)
Chloride: 105 mmol/L (ref 98–111)
Creatinine, Ser: 0.77 mg/dL (ref 0.44–1.00)
GFR, Estimated: >60 mL/min (ref >60)
Glucose, Bld: 104 mg/dL — ABNORMAL HIGH (ref 70–99)
Potassium: 3.8 mmol/L (ref 3.5–5.1)
Sodium: 139 mmol/L (ref 135–145)
Total Bilirubin: 0.5 mg/dL (ref 0.3–1.2)
Total Protein: 8 g/dL (ref 6.5–8.1)

## 2022-06-30 LAB — CBC WITH DIFFERENTIAL/PLATELET
Abs Immature Granulocytes: 0.05 10*3/uL (ref 0.00–0.07)
Basophils Absolute: 0.1 10*3/uL (ref 0.0–0.1)
Basophils Relative: 1 %
Eosinophils Absolute: 0.3 10*3/uL (ref 0.0–0.5)
Eosinophils Relative: 2 %
HCT: 48.1 % — ABNORMAL HIGH (ref 36.0–46.0)
Hemoglobin: 16.1 g/dL — ABNORMAL HIGH (ref 12.0–15.0)
Immature Granulocytes: 0 %
Lymphocytes Relative: 27 %
Lymphs Abs: 4.3 10*3/uL — ABNORMAL HIGH (ref 0.7–4.0)
MCH: 28.4 pg (ref 26.0–34.0)
MCHC: 33.5 g/dL (ref 30.0–36.0)
MCV: 84.8 fL (ref 80.0–100.0)
Monocytes Absolute: 1 10*3/uL (ref 0.1–1.0)
Monocytes Relative: 6 %
Neutro Abs: 10.1 10*3/uL — ABNORMAL HIGH (ref 1.7–7.7)
Neutrophils Relative %: 64 %
Platelets: 377 10*3/uL (ref 150–400)
RBC: 5.67 MIL/uL — ABNORMAL HIGH (ref 3.87–5.11)
RDW: 12.3 % (ref 11.5–15.5)
WBC: 15.9 10*3/uL — ABNORMAL HIGH (ref 4.0–10.5)
nRBC: 0 % (ref 0.0–0.2)

## 2022-06-30 LAB — PREGNANCY, URINE: Preg Test, Ur: NEGATIVE

## 2022-06-30 LAB — URINALYSIS, MICROSCOPIC (REFLEX)

## 2022-06-30 LAB — LIPASE, BLOOD: Lipase: 45 U/L (ref 11–51)

## 2022-06-30 MED ORDER — ONDANSETRON HCL 4 MG/2ML IJ SOLN
4.0000 mg | Freq: Once | INTRAMUSCULAR | Status: AC
  Administered 2022-06-30: 4 mg via INTRAVENOUS
  Filled 2022-06-30: qty 2

## 2022-06-30 MED ORDER — HYDROMORPHONE HCL 1 MG/ML IJ SOLN
1.0000 mg | Freq: Once | INTRAMUSCULAR | Status: AC
  Administered 2022-06-30: 1 mg via INTRAVENOUS
  Filled 2022-06-30: qty 1

## 2022-06-30 MED ORDER — MORPHINE SULFATE (PF) 4 MG/ML IV SOLN
4.0000 mg | Freq: Once | INTRAVENOUS | Status: AC
  Administered 2022-06-30: 4 mg via INTRAVENOUS
  Filled 2022-06-30: qty 1

## 2022-06-30 MED ORDER — ONDANSETRON HCL 4 MG PO TABS: 4.0000 mg | ORAL_TABLET | Freq: Four times a day (QID) | ORAL | 0 refills | Status: AC

## 2022-06-30 NOTE — ED Triage Notes (Signed)
Pt c/o LLQ abdominal pain and urinary urgency that worsened today. Seen on 11/8 for same dx with kidney stone. Pt thinks she is passing the stone. Unable to see urology due to insurance issues.

## 2022-06-30 NOTE — ED Notes (Signed)
PA back in to speak to pt , pt frustrated with insurance issues husband in room

## 2022-06-30 NOTE — Discharge Instructions (Addendum)
Please continue take your at home Flomax, oxycodone, nausea medication as needed.  I refilled your nausea medication as needed, you can use ibuprofen, Tylenol in addition to the narcotic pain medication, although be advised that the narcotic medication contains Tylenol so do not double up and take these at the same time.  If you have not heard from the urology group by tomorrow afternoon please call the number of attached above and ask about the status of your appointment with Dr. Sherryl Barters

## 2022-06-30 NOTE — ED Provider Notes (Signed)
[facility] EMERGENCY DEPARTMENT Provider Note   CSN: [id number] Arrival date & time: 06/30/22  1327     History  Chief Complaint  Patient presents with   Abdominal Pain    LLQ   Urinary Frequency    Dawn Logan is a 41 y.o. female with past medical history significant for previous kidney stones, migraines who presents with concern for ongoing left flank pain radiating to the groin, nausea.  She reports that she was seen on the eighth, however we do not have a CT at that time, had an ultrasound that showed a stone.  Patient cannot afford [facility] due to [facility] not being able to take her insurance, and is working on being scheduled for an appointment at [facility] u[facility] at this time, PCP put in a consult on 11/9 to attempt to schedule an appointment.  Patient reports that she has not been taking as much of the pain medication at home but has been taking a lot of nausea medication.  She denies any fever, chills.   Abdominal Pain Urinary Frequency Associated symptoms include abdominal pain.       Home Medications Prior to Admission medications   Medication Sig Start Date End Date Taking? Authorizing Provider  ondansetron (ZOFRAN) 4 MG tablet Take 1 tablet (4 mg total) by mouth every 6 (six) hours. 06/30/22  Yes Ersa Delaney H, PA-C  doxycycline (VIBRA-TABS) 100 MG tablet Take 1 tablet by mouth daily. 09/04/20   [provider]  ergocalciferol (VITAMIN D2) 1.25 MG (50000 UT) capsule Take 1 capsule by mouth once a week. 09/28/20   [provider]  fluticasone-salmeterol (ADVAIR HFA) 993-71 MCG/ACT inhaler Inhale into the lungs. 10/24/20   [provider]  ondansetron (ZOFRAN-ODT) 4 MG disintegrating tablet Take 1 tablet (4 mg total) by mouth every 8 (eight) hours as needed for nausea or vomiting. 06/25/22   Schutt, Edsel Petrin, PA-C  oxyCODONE (ROXICODONE) 5 MG immediate release tablet Take 1 tablet (5 mg total) by mouth every 6 (six) hours as needed  for severe pain. 06/25/22   Schutt, Edsel Petrin, PA-C  pantoprazole (PROTONIX) 40 MG tablet Take 1 tablet by mouth daily. 09/20/20   [provider]  SUMAtriptan (IMITREX) 100 MG tablet Take by mouth. 08/09/18   [provider]  tamsulosin (FLOMAX) 0.4 MG CAPS capsule Take 1 capsule (0.4 mg total) by mouth daily. 06/25/22   Schutt, Edsel Petrin, PA-C      Allergies    Latex    Review of Systems   Review of Systems  Gastrointestinal:  Positive for abdominal pain.  Genitourinary:  Positive for frequency.  All other systems reviewed and are negative.   Physical Exam Updated Vital Signs BP 136/86   Pulse 83   Temp 98.1 F (36.7 C) (Oral)   Resp 18   Ht 5\' 4"  (1.626 m)   Wt 95 kg   LMP 06/02/2022 (Approximate)   SpO2 99%   BMI 35.95 kg/m  Physical Exam Vitals and nursing note reviewed.  Constitutional:      General: She is not in acute distress.    Appearance: Normal appearance.  HENT:     Head: Normocephalic and atraumatic.  Eyes:     General:        Right eye: No discharge.        Left eye: No discharge.  Cardiovascular:     Rate and Rhythm: Normal rate and regular rhythm.     Heart sounds: No murmur heard.  No friction rub. No gallop.  Pulmonary:     Effort: Pulmonary effort is normal.     Breath sounds: Normal breath sounds.  Abdominal:     General: Bowel sounds are normal.     Palpations: Abdomen is soft.     Comments: Tenderness to palpation in the left lower quadrant, radiating to left groin, left flank, with positive left CVA tenderness.  No rebound, rigidity, guarding throughout.  No ecchymosis on abdomen wall.  Skin:    General: Skin is warm and dry.     Capillary Refill: Capillary refill takes less than 2 seconds.  Neurological:     Mental Status: She is alert and oriented to person, place, and time.  Psychiatric:        Mood and Affect: Mood normal.        Behavior: Behavior normal.     ED Results / Procedures / Treatments    Labs (all labs ordered are listed, but only abnormal results are displayed) Labs Reviewed  URINALYSIS, ROUTINE W REFLEX MICROSCOPIC - Abnormal; Notable for the following components:      Result Value   Hgb urine dipstick MODERATE (*)    All other components within normal limits  CBC WITH DIFFERENTIAL/PLATELET - Abnormal; Notable for the following components:   WBC 15.9 (*)    RBC 5.67 (*)    Hemoglobin 16.1 (*)    HCT 48.1 (*)    Neutro Abs 10.1 (*)    Lymphs Abs 4.3 (*)    All other components within normal limits  COMPREHENSIVE METABOLIC PANEL - Abnormal; Notable for the following components:   Glucose, Bld 104 (*)    All other components within normal limits  URINALYSIS, MICROSCOPIC (REFLEX) - Abnormal; Notable for the following components:   Bacteria, UA RARE (*)    All other components within normal limits  PREGNANCY, URINE  LIPASE, BLOOD    EKG None  Radiology CT Renal Stone Study  Result Date: 06/30/2022 CLINICAL DATA:  Left lower quadrant abdominal pain and urinary urgency. Left flank pain, stone suspected. EXAM: CT ABDOMEN AND PELVIS WITHOUT CONTRAST TECHNIQUE: Multidetector CT imaging of the abdomen and pelvis was performed following the standard protocol without IV contrast. RADIATION DOSE REDUCTION: This exam was performed according to the departmental dose-optimization program which includes automated exposure control, adjustment of the mA and/or kV according to patient size and/or use of iterative reconstruction technique. COMPARISON:  Renal ultrasound 06/25/2022 FINDINGS: Lower chest: The lung bases are clear without focal nodule, mass, or airspace disease. Heart size is normal. No significant pleural or pericardial effusion is present. Hepatobiliary: Diffuse fatty infiltration of the liver is again noted. Cholecystectomy noted. Common bile duct is within normal limits. Pancreas: Unremarkable. No pancreatic ductal dilatation or surrounding inflammatory changes.  Spleen: Normal in size without focal abnormality. Adrenals/Urinary Tract: The adrenal glands are normal bilaterally. Right kidney in ureter are within normal limits. Moderate left hydronephrosis demonstrates some progression since the ultrasound. No mass lesion or additional stone is present. Left ureter is dilated to the UVJ where a 7.5 mm struck ting stone is present. Urinary bladder is mostly collapsed. Stomach/Bowel: Stomach and duodenum are within normal limits. Small bowel is unremarkable. Terminal ileum is normal. Appendix is visualized and normal. The ascending and transverse colon are within normal limits. Descending and sigmoid colon are mostly collapsed. No inflammatory changes are present. Vascular/Lymphatic: Choose 1 Reproductive: Uterus and bilateral adnexa are unremarkable. Other: No abdominal wall hernia or abnormality. No abdominopelvic ascites. Musculoskeletal:  Vertebral body heights and alignment are normal. Straightening of the normal lumbar lordosis is present. No focal osseous lesions are present. Bony pelvis is normal. The hips are located and within normal limits bilaterally. IMPRESSION: 1. Obstructing 7.5 mm stone at the left UVJ with moderate left hydronephrosis. 2. Hepatic steatosis. 3. Cholecystectomy. Electronically Signed   By: Christopher  Mattern M.D.   On: 06/30/2022 Marin Roberts14:26    Procedures Procedures    Medications Ordered in ED Medications  morphine (PF) 4 MG/ML injection 4 mg (4 mg Intravenous Given 06/30/22 1523)  ondansetron (ZOFRAN) injection 4 mg (4 mg Intravenous Given 06/30/22 1522)  HYDROmorphone (DILAUDID) injection 1 mg (1 mg Intravenous Given 06/30/22 1651)    ED Course/ Medical Decision Making/ A&P Clinical Course as of 06/30/22 1733  Mon Jun 30, 2022  1712 Sherryl BartersBudzyn [CP]    Clinical Course User Index [CP] Olene FlossProsperi, Johathan Province H, PA-C                            Medical Decision Making Amount and/or Complexity of Data Reviewed Labs: ordered. Radiology:  ordered.  Risk Prescription drug management.   This patient is a 41 y.o. female who presents to the ED for concern of left lower quadrant abdominal pain, urinary urgency, worsened today with known diagnosis of kidney stone 5 days ago, this involves an extensive number of treatment options, and is a complaint that carries with it a high risk of complications and morbidity. The emergent differential diagnosis prior to evaluation includes, but is not limited to, obstructed stone, infected stone, overlying UTI, intra-abdominal abscess, ureteral rupture, TOA, versus other, possible overlying diverticulitis versus other intra-abdominal abnormality.   This is not an exhaustive differential.   Past Medical History / Co-morbidities / Social History: Previous history of kidney stones, migraines  Additional history: Chart reviewed. Pertinent results include: Reviewed lab work, imaging from recent previous emergency department visits, as well as several outpatient urology visits  Physical Exam: Physical exam performed. The pertinent findings include: Patient with left-sided CVA tenderness, no rebound, rigidity, guarding, pain radiating to left flank, and towards left groin.  Lab Tests: I ordered, and personally interpreted labs.  The pertinent results include: UA notable for no evidence of acute UTI, however moderate hemoglobin seen.  Her CMP is overall unremarkable, notably normal creatinine.  CBC notable for leukocytosis, white blood cells 15.9.   Imaging Studies: I ordered imaging studies including CT renal stone study. I independently visualized and interpreted imaging which showed 7.5 mm stone at the left UVJ with obstruction, and upstream hydronephrosis. I agree with the radiologist interpretation.   Medications: I ordered medication including morphine, Dilaudid, Zofran for pain, nausea. Reevaluation of the patient after these medicines showed that the patient improved. I have reviewed the  patients home medicines and have made adjustments as needed.  Consultations Obtained: I requested consultation with the urologist, I was able to speak with [facility] Dr. Sherryl BartersBudzyn,  and discussed lab and imaging findings as well as pertinent plan - they recommend: They will get her scheduled for a procedure for obstructing stone in the left UVJ in the next 48 hours.  Patient reports that she thinks she can manage the pain at home with knowledge of a known follow-up appointment.  I think based on her vital signs, lack of overlying UTI, lack of kidney dysfunction this is a reasonable plan and I feel comfortable with discharging this patient.   Disposition: After consideration of  the diagnostic results and the patients response to treatment, I feel that patient is stable for discharge with urgent follow-up for stone removal as discussed above.   emergency department workup does not suggest an emergent condition requiring admission or immediate intervention beyond what has been performed at this time. The plan is: as above. The patient is safe for discharge and has been instructed to return immediately for worsening symptoms, change in symptoms or any other concerns.  I discussed this case with my attending physician Dr. Particia Nearing who cosigned this note including patient's presenting symptoms, physical exam, and planned diagnostics and interventions. Attending physician stated agreement with plan or made changes to plan which were implemented.    Final Clinical Impression(s) / ED Diagnoses Final diagnoses:  Ureteral stone with hydronephrosis    Rx / DC Orders ED Discharge Orders          Ordered    ondansetron (ZOFRAN) 4 MG tablet  Every 6 hours        06/30/22 1729              Olene Floss, PA-C 06/30/22 1733    Jacalyn Lefevre, MD 06/30/22 [phone]
